# Patient Record
Sex: Male | Born: 1945
Health system: Southern US, Community
[De-identification: ages and names within clinical notes are randomized; demographics above are authoritative.]

## PROBLEM LIST (undated history)

## (undated) DIAGNOSIS — Z87442 Personal history of urinary calculi: Secondary | ICD-10-CM

## (undated) DIAGNOSIS — J45909 Unspecified asthma, uncomplicated: Secondary | ICD-10-CM

## (undated) DIAGNOSIS — K219 Gastro-esophageal reflux disease without esophagitis: Secondary | ICD-10-CM

## (undated) DIAGNOSIS — H269 Unspecified cataract: Secondary | ICD-10-CM

## (undated) DIAGNOSIS — M1990 Unspecified osteoarthritis, unspecified site: Secondary | ICD-10-CM

## (undated) DIAGNOSIS — I10 Essential (primary) hypertension: Secondary | ICD-10-CM

## (undated) HISTORY — DX: Gastro-esophageal reflux disease without esophagitis: K21.9

## (undated) HISTORY — PX: POLYPECTOMY: SHX149

## (undated) HISTORY — DX: Essential (primary) hypertension: I10

## (undated) HISTORY — PX: COLONOSCOPY: SHX174

## (undated) HISTORY — PX: CARPAL TUNNEL RELEASE: SHX101

## (undated) HISTORY — DX: Unspecified cataract: H26.9

## (undated) HISTORY — DX: Unspecified asthma, uncomplicated: J45.909

---

## 2004-06-12 HISTORY — PX: CARPAL TUNNEL RELEASE: SHX101

## 2006-01-11 ENCOUNTER — Encounter (INDEPENDENT_AMBULATORY_CARE_PROVIDER_SITE_OTHER): Payer: Self-pay | Admitting: *Deleted

## 2006-01-11 ENCOUNTER — Ambulatory Visit (HOSPITAL_BASED_OUTPATIENT_CLINIC_OR_DEPARTMENT_OTHER): Admission: RE | Admit: 2006-01-11 | Discharge: 2006-01-11 | Payer: Self-pay | Admitting: Orthopedic Surgery

## 2009-09-15 ENCOUNTER — Encounter (INDEPENDENT_AMBULATORY_CARE_PROVIDER_SITE_OTHER): Payer: Self-pay | Admitting: *Deleted

## 2009-09-16 ENCOUNTER — Encounter (INDEPENDENT_AMBULATORY_CARE_PROVIDER_SITE_OTHER): Payer: Self-pay | Admitting: *Deleted

## 2009-09-20 ENCOUNTER — Ambulatory Visit: Payer: Self-pay | Admitting: Gastroenterology

## 2009-10-11 ENCOUNTER — Ambulatory Visit: Payer: Self-pay | Admitting: Gastroenterology

## 2009-10-19 ENCOUNTER — Encounter: Payer: Self-pay | Admitting: Gastroenterology

## 2010-02-22 ENCOUNTER — Encounter: Payer: Self-pay | Admitting: Gastroenterology

## 2010-03-08 ENCOUNTER — Encounter: Payer: Self-pay | Admitting: Gastroenterology

## 2010-04-28 ENCOUNTER — Encounter (INDEPENDENT_AMBULATORY_CARE_PROVIDER_SITE_OTHER): Payer: Self-pay | Admitting: *Deleted

## 2010-06-03 ENCOUNTER — Encounter (INDEPENDENT_AMBULATORY_CARE_PROVIDER_SITE_OTHER): Payer: Self-pay | Admitting: *Deleted

## 2010-06-08 ENCOUNTER — Ambulatory Visit: Payer: Self-pay | Admitting: Gastroenterology

## 2010-06-12 HISTORY — PX: BACK SURGERY: SHX140

## 2010-06-22 ENCOUNTER — Encounter: Payer: Self-pay | Admitting: Gastroenterology

## 2010-06-22 ENCOUNTER — Ambulatory Visit
Admission: RE | Admit: 2010-06-22 | Discharge: 2010-06-22 | Payer: Self-pay | Source: Home / Self Care | Attending: Gastroenterology | Admitting: Gastroenterology

## 2010-07-12 NOTE — Letter (Signed)
Summary: Previsit letter  [facility]  [address] [place], [place] [address]   Phone: [phone]  Fax: [phone]       09/15/2009 MRN: [id number]  Adam Sherman [address] [place], [place]  [address]  Dear Mr. Norment,  Welcome to the Gastroenterology Division at [facility].    You are scheduled to see a nurse for your pre-procedure visit on 09-20-09 at 11:00a.m. on the 3rd floor at [facility], [address]  We ask that you try to arrive at our office 15 minutes prior to your appointment time to allow for check-in.  Your nurse visit will consist of discussing your medical and surgical history, your immediate family medical history, and your medications.    Please bring a complete list of all your medications or, if you prefer, bring the medication bottles and we will list them.  We will need to be aware of both prescribed and over the counter drugs.  We will need to know exact dosage information as well.  If you are on blood thinners (Coumadin, Plavix, Aggrenox, Ticlid, etc.) please call our office today/prior to your appointment, as we need to consult with your physician about holding your medication.   Please be prepared to read and sign documents such as consent forms, a financial agreement, and acknowledgement forms.  If necessary, and with your consent, a friend or relative is welcome to sit-in on the nurse visit with you.  Please bring your insurance card so that we may make a copy of it.  If your insurance requires a referral to see a specialist, please bring your referral form from your primary care physician.  No co-pay is required for this nurse visit.     If you cannot keep your appointment, please call [phone] to cancel or reschedule prior to your appointment date.  This allows [place] the opportunity to schedule an appointment for another patient in need of care.    Thank you for choosing [facility] for your medical  needs.  We appreciate the opportunity to care for you.  Please visit [place] at our website  to learn more about our practice.                     Sincerely.                                                                                                                   The Gastroenterology Division

## 2010-07-12 NOTE — Procedures (Signed)
Summary: Colonoscopy  Patient: Adam Sherman Note: All result statuses are Final unless otherwise noted.  Tests: (1) Colonoscopy (COL)   COL Colonoscopy           DONE     [facility]     [address]     [place], [place]  [address]           COLONOSCOPY PROCEDURE REPORT           PATIENT:  Adam Sherman, Adam Sherman  MR#:  [id number]     BIRTHDATE:  [birth year], 63 yrs. old  GENDER:  male     ENDOSCOPIST:  Rachael Fee, MD     REF. BY:  Adela Lank, M.D.     PROCEDURE DATE:  10/11/2009     PROCEDURE:  Colonoscopy with snare polypectomy     ASA CLASS:  Class II     INDICATIONS:  Routine Risk Screening     MEDICATIONS:   Fentanyl 25 mcg IV, Versed 4 mg IV           DESCRIPTION OF PROCEDURE:   After the risks benefits and     alternatives of the procedure were thoroughly explained, informed     consent was obtained.  Digital rectal exam was performed and     revealed no rectal masses.   The LB PCF-H180AL [id number] endoscope     was introduced through the anus and advanced to the cecum, which     was identified by both the appendix and ileocecal valve, without     limitations.  The quality of the prep was good, using MoviPrep.     The instrument was then slowly withdrawn as the colon was fully     examined.     <<PROCEDUREIMAGES>>           FINDINGS:  There were two sessile polyps in rectum. One was 4mm     across, removed with cold snare, located in proximal rectum, sent     to pathology (jar 1). One was 12mm across, located in very distal     rectum, probably 1cm from anal verge, removed with snare cauter in     piecemeal fashion and sent to pathology (jar 2) (see image5,     image6, image7, and image8).  Mild diverticulosis was found in the     sigmoid to descending colon segments (see image3).  External     hemorrhoids were found. These were small, not thrombosed.  This     was otherwise a normal examination of the colon (see image9,     image1, and image2).    Retroflexed views in the rectum revealed no     abnormalities.    The scope was then withdrawn from the patient     and the procedure completed.           COMPLICATIONS:  None     ENDOSCOPIC IMPRESSION:     1) Two polyps in rectum, one was >1cm and resected in piecemeal     fashion.  Both removed and sent to pathology.     2) Mild diverticulosis in the sigmoid to descending colon     segments     3) External hemorrhoids     4) Otherwise normal examination           RECOMMENDATIONS:     1) If the polyp(s) removed today are proven to be adenomatous     (pre-cancerous) polyps, you will need a colonoscopy in  1-3.     2) You will receive a letter within 1-2 weeks with the results     of your biopsy as well as final recommendations. Please call my     office if you have not received a letter after 3 weeks.           ______________________________     Rachael Fee, MD           n.     eSIGNED:   Rachael Fee at 10/11/2009 02:45 PM           Georges Lynch, [id number]  Note: An exclamation mark (!) indicates a result that was not dispersed into the flowsheet. Document Creation Date: 10/11/2009 2:47 PM _______________________________________________________________________  (1) Order result status: Final Collection or observation date-time: 10/11/2009 14:38 Requested date-time:  Receipt date-time:  Reported date-time:  Referring Physician:   Ordering Physician: Rob Bunting [phone]) Specimen Source:  Source: Launa Grill Order Number: [id number] Lab site:   Appended Document: Colonoscopy     Procedures Next Due Date:    Flexible Sigmoidoscopy: 04/2010

## 2010-07-12 NOTE — Letter (Signed)
Summary: Pre Visit Letter Revised  [facility]  [address] [place], [place] [address]   Phone: [phone]  Fax: [phone]        04/28/2010 MRN: [id number]   Adam Sherman [address] [place], [place]  [address]  Procedure Date: June 22, 2010  Welcome to the Gastroenterology Division at [facility].    You are scheduled to see a nurse for your pre-procedure visit on 06/08/10 at 9:00 A.M. on the 3rd floor at [facility], [address].  We ask that you try to arrive at our office 15 minutes prior to your appointment time to allow for check-in.  Please take a minute to review the attached form.  If you answer "Yes" to one or more of the questions on the first page, we ask that you call the person listed at your earliest opportunity.  If you answer "No" to all of the questions, please complete the rest of the form and bring it to your appointment.    Your nurse visit will consist of discussing your medical and surgical history, your immediate family medical history, and your medications.   If you are unable to list all of your medications on the form, please bring the medication bottles to your appointment and we will list them.  We will need to be aware of both prescribed and over the counter drugs.  We will need to know exact dosage information as well.    Please be prepared to read and sign documents such as consent forms, a financial agreement, and acknowledgement forms.  If necessary, and with your consent, a friend or relative is welcome to sit-in on the nurse visit with you.  Please bring your insurance card so that we may make a copy of it.  If your insurance requires a referral to see a specialist, please bring your referral form from your primary care physician.  No co-pay is required for this nurse visit.     If you cannot keep your appointment, please call [phone] to cancel or reschedule prior to your appointment date.  This allows  [place] the opportunity to schedule an appointment for another patient in need of care.    Thank you for choosing [facility] for your medical needs.  We appreciate the opportunity to care for you.  Please visit [place] at our website  to learn more about our practice.  Sincerely, The Gastroenterology Division

## 2010-07-12 NOTE — Miscellaneous (Signed)
Summary: previsit/rm  Clinical Lists Changes  Medications: Added new medication of MOVIPREP 100 GM  SOLR (PEG-KCL-NACL-NASULF-NA ASC-C) As per prep instructions. - Signed Rx of MOVIPREP 100 GM  SOLR (PEG-KCL-NACL-NASULF-NA ASC-C) As per prep instructions.;  #1 x 0;  Signed;  Entered by: Sherren Kerns RN;  Authorized by: Rachael Fee MD;  Method used: Electronically to [facility] [address] [place], [place], [place]  [address], Ph: [phone], Fax: [phone] Observations: Added new observation of ALLERGY REV: Done (09/20/2009 10:47) Added new observation of NKA: T (09/20/2009 10:47)    Prescriptions: MOVIPREP 100 GM  SOLR (PEG-KCL-NACL-NASULF-NA ASC-C) As per prep instructions.  #1 x 0   Entered by:   Sherren Kerns RN   Authorized by:   Rachael Fee MD   Signed by:   Sherren Kerns RN on 09/20/2009   Method used:   Electronically to        [facility] (retail)       [address]       [place], [place]  [address]       Ph: [phone]       Fax: [phone]   RxID:   [id number]

## 2010-07-12 NOTE — Letter (Signed)
Summary: [facility] Instructions  [facility]  [address] [place], [place] [address]   Phone: [phone]  Fax: [phone]       Adam Sherman    06-01-1946    MRN: [id number]        Procedure Day /Date: Monday  10/11/09     Arrival Time:  10:30am     Procedure Time:  11:30am     Location of Procedure:                    _ X_  [facility] (4th Floor)                        PREPARATION FOR COLONOSCOPY WITH MOVIPREP   Starting 5 days prior to your procedure Wednesday 04/27  do not eat nuts, seeds, popcorn, corn, beans, peas,  salads, or any raw vegetables.  Do not take any fiber supplements (e.g. Metamucil, Citrucel, and Benefiber).  THE DAY BEFORE YOUR PROCEDURE         DATE: 05/01  DAY: Sunday  1.  Drink clear liquids the entire day-NO SOLID FOOD  2.  Do not drink anything colored red or purple.  Avoid juices with pulp.  No orange juice.  3.  Drink at least 64 oz. (8 glasses) of fluid/clear liquids during the day to prevent dehydration and help the prep work efficiently.  CLEAR LIQUIDS INCLUDE: Water Jello Ice Popsicles Tea (sugar ok, no milk/cream) Powdered fruit flavored drinks Coffee (sugar ok, no milk/cream) Gatorade Juice: apple, white grape, white cranberry  Lemonade Clear bullion, consomm, broth Carbonated beverages (any kind) Strained chicken noodle soup Hard Candy                             4.  In the morning, mix first dose of MoviPrep solution:    Empty 1 Pouch A and 1 Pouch B into the disposable container    Add lukewarm drinking water to the top line of the container. Mix to dissolve    Refrigerate (mixed solution should be used within 24 hrs)  5.  Begin drinking the prep at 5:00 p.m. The MoviPrep container is divided by 4 marks.   Every 15 minutes drink the solution down to the next mark (approximately 8 oz) until the full liter is complete.   6.  Follow completed prep with 16 oz of clear liquid of your choice  (Nothing red or purple).  Continue to drink clear liquids until bedtime.  7.  Before going to bed, mix second dose of MoviPrep solution:    Empty 1 Pouch A and 1 Pouch B into the disposable container    Add lukewarm drinking water to the top line of the container. Mix to dissolve    Refrigerate  THE DAY OF YOUR PROCEDURE      DATE: 05/02  DAY: Monday  Beginning at  6:30 a.m. (5 hours before procedure):         1. Every 15 minutes, drink the solution down to the next mark (approx 8 oz) until the full liter is complete.  2. Follow completed prep with 16 oz. of clear liquid of your choice.    3. You may drink clear liquids until  9:30am (2 HOURS BEFORE PROCEDURE).   MEDICATION INSTRUCTIONS  Unless otherwise instructed, you should take regular prescription medications with a small sip of water   as early  as possible the morning of your procedure.   Additional medication instructions: n/a         OTHER INSTRUCTIONS  You will need a responsible adult at least 63 years of age to accompany you and drive you home.   This person must remain in the waiting room during your procedure.  Wear loose fitting clothing that is easily removed.  Leave jewelry and other valuables at home.  However, you may wish to bring a book to read or  an iPod/MP3 player to listen to music as you wait for your procedure to start.  Remove all body piercing jewelry and leave at home.  Total time from sign-in until discharge is approximately 2-3 hours.  You should go home directly after your procedure and rest.  You can resume normal activities the  day after your procedure.  The day of your procedure you should not:   Drive   Make legal decisions   Operate machinery   Drink alcohol   Return to work  You will receive specific instructions about eating, activities and medications before you leave.    The above instructions have been reviewed and explained to me by   Sherren Kerns RN  September 20, 2009 11:03 AM     I fully understand and can verbalize these instructions _____________________________ Date _________

## 2010-07-12 NOTE — Letter (Signed)
Summary: Flex Sigmoidoscopy Letter  [facility]  [address] [place], [place] [address]   Phone: [phone]  Fax: [phone]      March 08, 2010 MRN: [id number]   Adam Sherman [address] [place], [place]  [address]   Dear [name],   According to your medical record, it is time for you to schedule a Flexible Sigmoidoscopy. The American Cancer Society recommends this procedure as a method to detect early colon cancer. Patients with a family history of colon cancer, or a personal history of colon polyps or imflammatory bowel disease are at increased risk.  This letter has been generated based on the recommendations made at the time of your prior procedure. If you feel that in your particular situation this may no longer apply, please contact our office.  Please call our office at [phone]) to schedule this appointment or to update your records at your earliest convenience.  Thank you for cooperating with [place] to provide you with the very best care possible.   Sincerely,  Rachael Fee, M.D.   [facility] [phone]

## 2010-07-12 NOTE — Procedures (Signed)
Summary: Recall Assessment/[facility]  Recall Assessment/ GI   Imported By: Sherian Rein 03/11/2010 12:05:36  _____________________________________________________________________  External Attachment:    Type:   Image     Comment:   External Document

## 2010-07-12 NOTE — Letter (Signed)
Summary: Results Letter  [facility]  [address] [place], [place] [address]   Phone: [phone]  Fax: [phone]        Oct 19, 2009 MRN: [id number]    Adam Sherman [address] [place], [place]  [address]    Dear [name],   The polyp(s) removed during your recent procedure were proven to be adenomatous.  These are pre-cancerous polyps that may have grown into cancers if they had not been removed.  Based on current nationally recognized surveillance guidelines, I recommend that you have a flexible sigmoidoscopy in 6 months.   We will therefore put your information in our reminder system and will contact you in 6 months to schedule a repeat procedure.  Please call if you have any questions or concerns.       Sincerely,  Rachael Fee MD  This letter has been electronically signed by your physician.  Appended Document: Results Letter letter mailed 5.10.11

## 2010-07-14 NOTE — Procedures (Signed)
Summary: Flexible Sigmoidoscopy  Patient: [name] Note: All result statuses are Final unless otherwise noted.  Tests: (1) Flexible Sigmoidoscopy (FLX)  FLX Flexible Sigmoidoscopy                             DONE     [facility]     [address]     [place], [place]  [address]           FLEXIBLE SIGMOIDOSCOPY PROCEDURE REPORT           PATIENT:  Adam Sherman, Adam Sherman  MR#:  [id number]     BIRTHDATE:  1945-09-09, 64 yrs. old  GENDER:  male     ENDOSCOPIST:  Rachael Fee, MD     PROCEDURE DATE:  06/22/2010     PROCEDURE:  Flexible Sigmoidoscopy, diagnostic     ASA CLASS:  Class II     INDICATIONS:  adenomatous polyp removed from rectum 7 months ago     in piecemeal fashion     MEDICATIONS:  none           DESCRIPTION OF PROCEDURE:   After the risks benefits and     alternatives of the procedure were thoroughly explained, informed     consent was obtained.  Digital rectal exam was performed and     revealed no rectal masses.   The LB-PCF-H180AL [id number] endoscope     was introduced through the anus and advanced to the sigmoid colon,     without limitations.  The quality of the prep was good.  The     instrument was then slowly withdrawn as the mucosa was fully     examined.     <<PROCEDUREIMAGES>>     The site of adenomatous polyp removed was located very close to     anus. There was no clear, remaining adenomatous mucosa. The site     was treated with cautery from tip of cautery (see image1, image2,     image3, and image4).   Retroflexed views in the rectum revealed no     abnormalities.    The scope was then withdrawn from the patient     and the procedure terminated.     COMPLICATIONS:  None           ENDOSCOPIC IMPRESSION:     1) Site of previous piecemeal removal of 12mm polyp was clear of     obvious adenomatous mucosa, treated with cautery.           RECOMMENDATIONS:     Repeat colonoscopy in 3 years (adenoma removed last year was     12mm).        REPEAT EXAM:  3 years           ______________________________     Rachael Fee, MD           cc: Adela Lank, MD           n.     eSIGNED:   Rachael Fee at 06/22/2010 10:14 AM           Georges Lynch, [id number]  Note: An exclamation mark (!) indicates a result that was not dispersed into the flowsheet. Document Creation Date: 06/22/2010 10:14 AM _______________________________________________________________________  (1) Order result status: Final Collection or observation date-time: 06/22/2010 10:07 Requested date-time:  Receipt date-time:  Reported date-time:  Referring Physician:   Ordering Physician: Rob Bunting [phone]) Specimen Source:  Source: Launa Grill Order Number: [id number] Lab site:   Appended Document: Flexible Sigmoidoscopy    Clinical Lists Changes  Observations: Added new observation of COLONNXTDUE: 06/2013 (06/22/2010 13:48)

## 2010-07-14 NOTE — Miscellaneous (Signed)
Summary: [facility] Previsit/prep  Clinical Lists Changes  Observations: Added new observation of NKA: T (06/08/2010 8:33)

## 2010-07-14 NOTE — Letter (Signed)
Summary: [facility]  [address] [place], [place] [address]   Phone: [phone]  Fax: [phone]       Adam Sherman    07-07-1945    MRN: [id number]        Procedure Day Dorna Bloom: Wednesday 06-22-10      Arrival Time: 9:00 am     Procedure Time: 10:00 am     Location of Procedure:                    _x _  [facility] (4th Floor)  PREPARATION FOR FLEXIBLE SIGMOIDOSCOPY WITH MAGNESIUM CITRATE  Prior to the day before your procedure, purchase one 8 oz. bottle of Magnesium Citrate and one Fleet Enema from the laxative section of your drugstore.  _________________________________________________________________________________________________  THE DAY BEFORE YOUR PROCEDURE             DATE:  06-21-10   DAY:  Tuesday  1.   Have a clear liquid dinner the night before your procedure.  2.   Do not drink anything colored red or purple.  Avoid juices with pulp.  No orange juice.              CLEAR LIQUIDS INCLUDE: Water Jello Ice Popsicles Tea (sugar ok, no milk/cream) Powdered fruit flavored drinks Coffee (sugar ok, no milk/cream) Gatorade Juice: apple, white grape, white cranberry  Lemonade Clear bullion, consomm, broth Carbonated beverages (any kind) Strained chicken noodle soup Hard Candy   3.   At 7:00 pm the night before your procedure, drink one bottle of Magnesium Citrate over ice.  4.   Drink at least 3 more glasses of clear liquids before bedtime (preferably juices).  5.   Results are expected usually within 1 to 6 hours after taking the Magnesium Citrate.  ___________________________________________________________________________________________________  THE DAY OF YOUR PROCEDURE            DATE:  06-22-10 DAY:  Wednesday  1.   Use Fleet Enema one hour prior to coming for procedure .  2.   You may drink clear liquids until  8:00 a.m. (2 hours before exam)       MEDICATION INSTRUCTIONS  Unless  otherwise instructed, you should take regular prescription medications with a small sip of water as early as possible the morning of your procedure.  dditional medication instructions: Hold Losartan/HCTZ the morning of procedure.         OTHER INSTRUCTIONS  You will need a responsible adult at least 63 years of age to accompany you and drive you home.   This person must remain in the waiting room during your procedure.  Wear loose fitting clothing that is easily removed.  Leave jewelry and other valuables at home.  However, you may wish to bring a book to read or an iPod/MP3 player to listen to music as you wait for your procedure to start.  Remove all body piercing jewelry and leave at home.  Total time from sign-in until discharge is approximately 2-3 hours.  You should go home directly after your procedure and rest.  You can resume normal activities the day after your procedure.  The day of your procedure you should not:   Drive   Make legal decisions   Operate machinery   Drink alcohol   Return to work  You will receive specific instructions about eating, activities and medications before you leave.   The above instructions have been reviewed and explained to  me by   Wyona Almas RN  June 08, 2010 9:16 AM     I fully understand and can verbalize these instructions _____________________________ Date _________

## 2010-09-07 ENCOUNTER — Encounter (HOSPITAL_COMMUNITY)
Admission: RE | Admit: 2010-09-07 | Discharge: 2010-09-07 | Disposition: A | Discharge status: Home / Self Care | Payer: BC Managed Care – PPO | Source: Ambulatory Visit | Attending: Neurosurgery | Admitting: Neurosurgery

## 2010-09-07 ENCOUNTER — Other Ambulatory Visit (HOSPITAL_COMMUNITY): Payer: Self-pay | Admitting: Neurosurgery

## 2010-09-07 ENCOUNTER — Ambulatory Visit (HOSPITAL_COMMUNITY)
Admission: RE | Admit: 2010-09-07 | Discharge: 2010-09-07 | Disposition: A | Discharge status: Home / Self Care | Payer: BC Managed Care – PPO | Source: Ambulatory Visit | Attending: Neurosurgery | Admitting: Neurosurgery

## 2010-09-07 DIAGNOSIS — M5136 Other intervertebral disc degeneration, lumbar region: Secondary | ICD-10-CM

## 2010-09-07 DIAGNOSIS — Z01810 Encounter for preprocedural cardiovascular examination: Secondary | ICD-10-CM | POA: Insufficient documentation

## 2010-09-07 DIAGNOSIS — Z01812 Encounter for preprocedural laboratory examination: Secondary | ICD-10-CM | POA: Insufficient documentation

## 2010-09-07 DIAGNOSIS — M5137 Other intervertebral disc degeneration, lumbosacral region: Secondary | ICD-10-CM | POA: Insufficient documentation

## 2010-09-07 DIAGNOSIS — Z01818 Encounter for other preprocedural examination: Secondary | ICD-10-CM | POA: Insufficient documentation

## 2010-09-07 DIAGNOSIS — M51379 Other intervertebral disc degeneration, lumbosacral region without mention of lumbar back pain or lower extremity pain: Secondary | ICD-10-CM | POA: Insufficient documentation

## 2010-09-07 LAB — CBC
HCT: 44.5 % (ref 39.0–52.0)
Hemoglobin: 15.4 g/dL (ref 13.0–17.0)
RBC: 5.05 MIL/uL (ref 4.22–5.81)
WBC: 7.2 10*3/uL (ref 4.0–10.5)

## 2010-09-07 LAB — SURGICAL PCR SCREEN
MRSA, PCR: NEGATIVE
Staphylococcus aureus: POSITIVE — AB

## 2010-09-07 LAB — BASIC METABOLIC PANEL
Chloride: 100 mEq/L (ref 96–112)
GFR calc Af Amer: >60 mL/min (ref >60)
Potassium: 4.5 mEq/L (ref 3.5–5.1)
Sodium: 141 mEq/L (ref 135–145)

## 2010-09-07 IMAGING — CR DG CHEST 2V
2 series · 2 of 2 positions shown · non-contrast
Comparison: None.

CLINICAL DATA: Preop chest x-ray

CHEST - 2 VIEW

[view not recorded (1 of 2)]
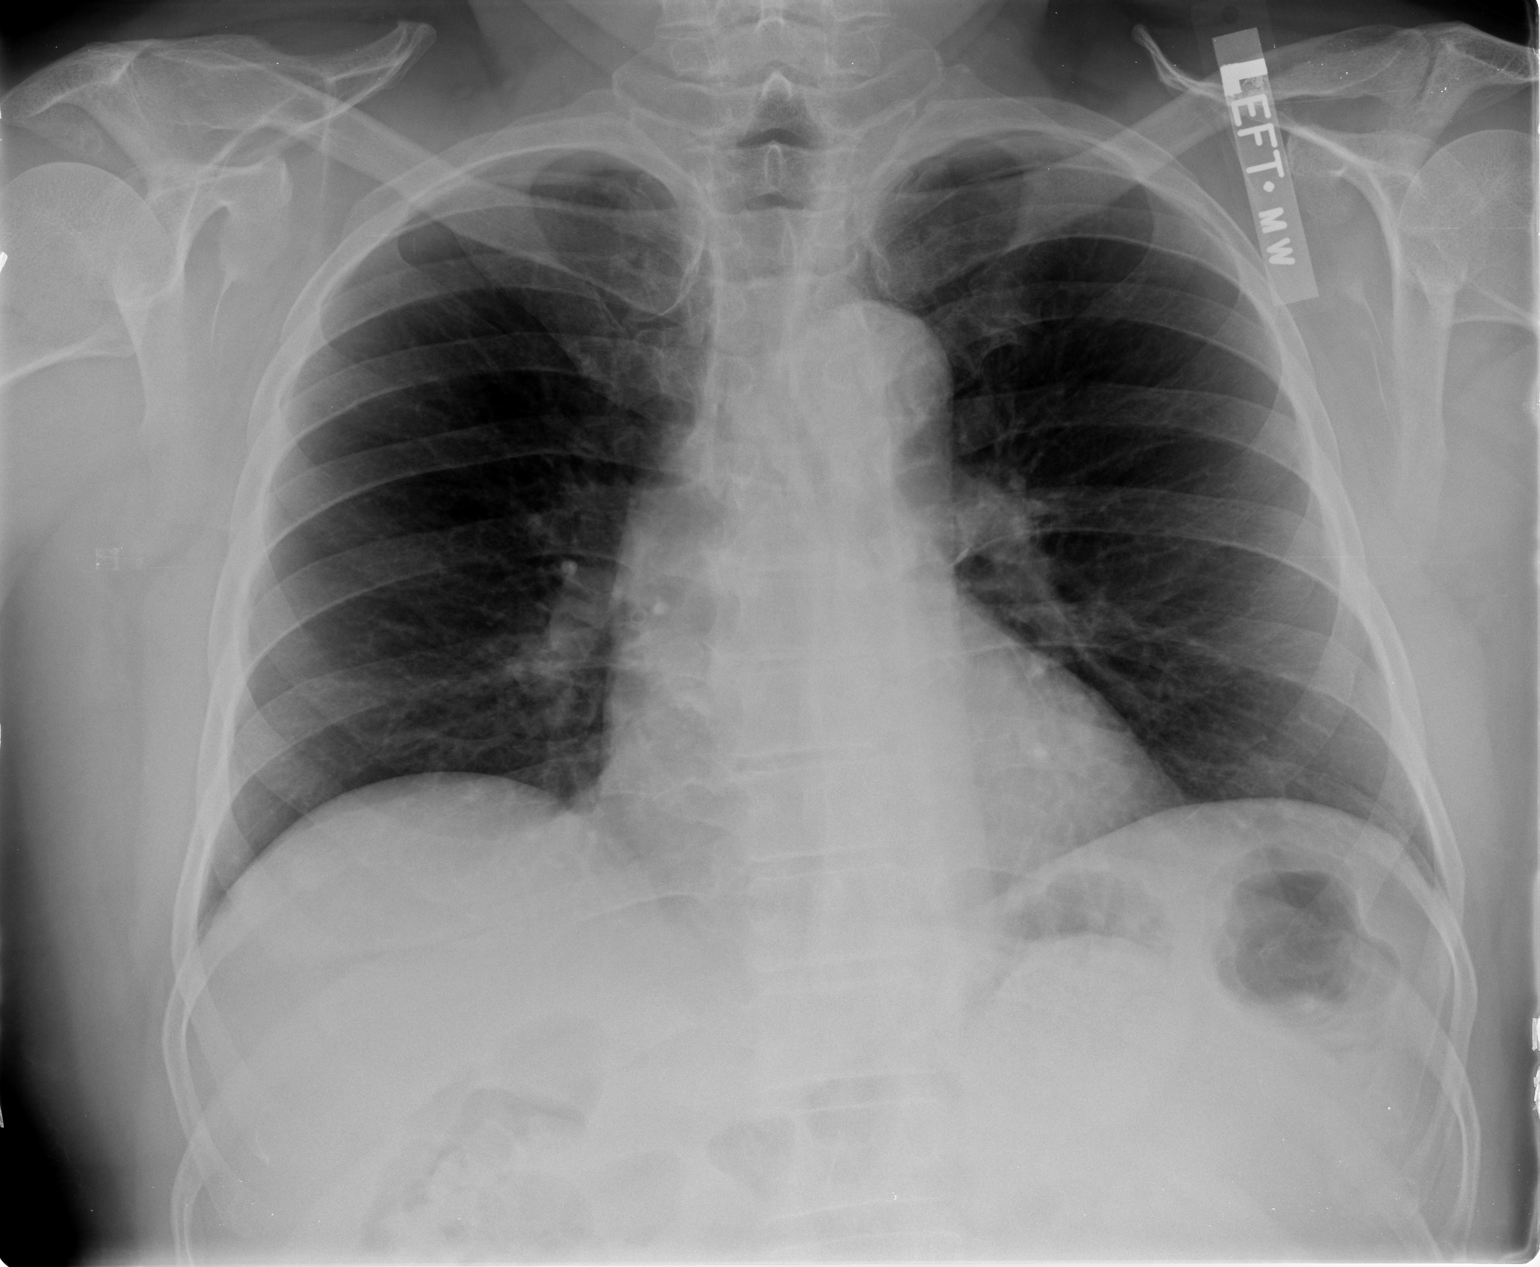

[view not recorded (2 of 2)]
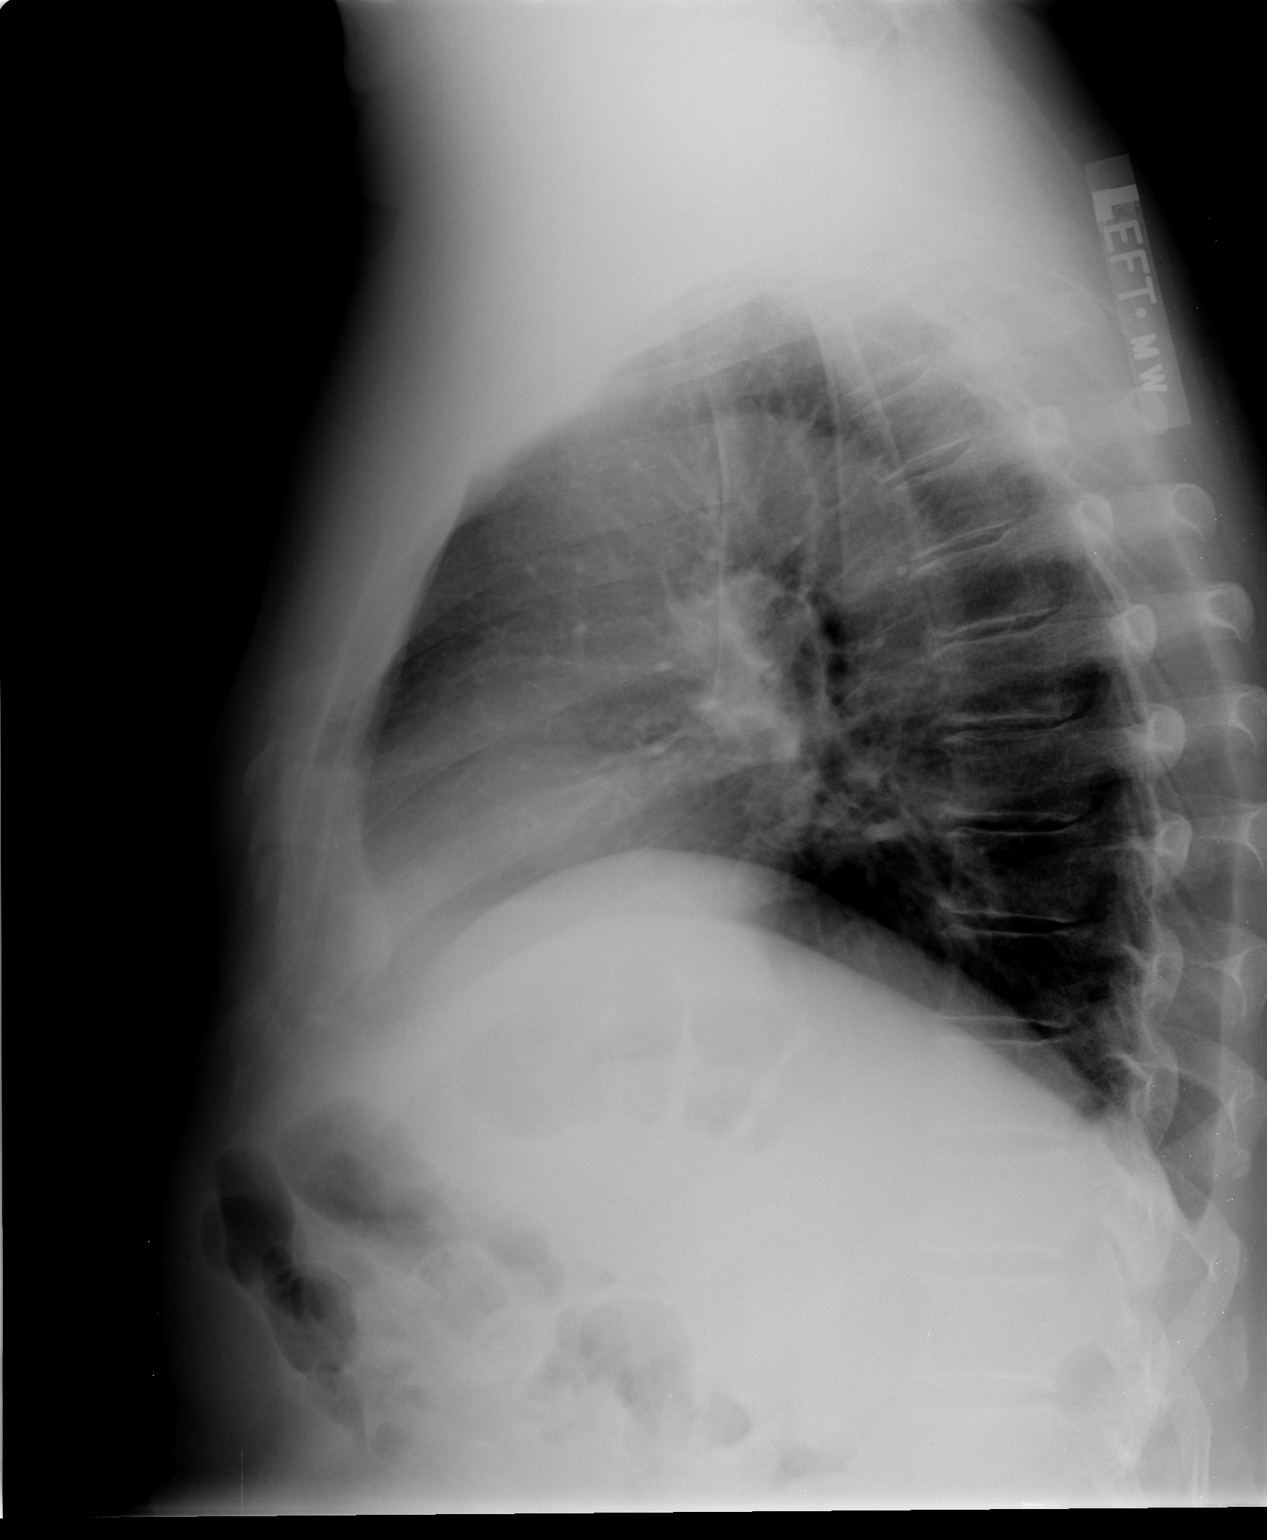

[2 of 2 positions shown; findings below may reference images not displayed]

FINDINGS: Relative low level of inspiration. Heart and mediastinal
contours normal.  Lungs clear.  No pleural fluid.  Osseous
structures and soft tissues unremarkable.
IMPRESSION: No acute or significant findings.

## 2010-09-09 ENCOUNTER — Observation Stay (HOSPITAL_COMMUNITY): Payer: BC Managed Care – PPO

## 2010-09-09 ENCOUNTER — Observation Stay (HOSPITAL_COMMUNITY)
Admission: RE | Admit: 2010-09-09 | Discharge: 2010-09-10 | Disposition: A | Discharge status: Home / Self Care | Payer: BC Managed Care – PPO | Source: Ambulatory Visit | Attending: Neurosurgery | Admitting: Neurosurgery

## 2010-09-09 DIAGNOSIS — I10 Essential (primary) hypertension: Secondary | ICD-10-CM | POA: Insufficient documentation

## 2010-09-09 DIAGNOSIS — K219 Gastro-esophageal reflux disease without esophagitis: Secondary | ICD-10-CM | POA: Insufficient documentation

## 2010-09-09 DIAGNOSIS — J45909 Unspecified asthma, uncomplicated: Secondary | ICD-10-CM | POA: Insufficient documentation

## 2010-09-09 DIAGNOSIS — M5126 Other intervertebral disc displacement, lumbar region: Principal | ICD-10-CM | POA: Insufficient documentation

## 2010-09-09 IMAGING — CR DG LUMBAR SPINE 1V
1 series · 1 of 1 positions shown · non-contrast
Comparison: None

CLINICAL DATA: Intraoperative localization for lumbar surgery.

LUMBAR SPINE - 1 VIEW

[view not recorded]
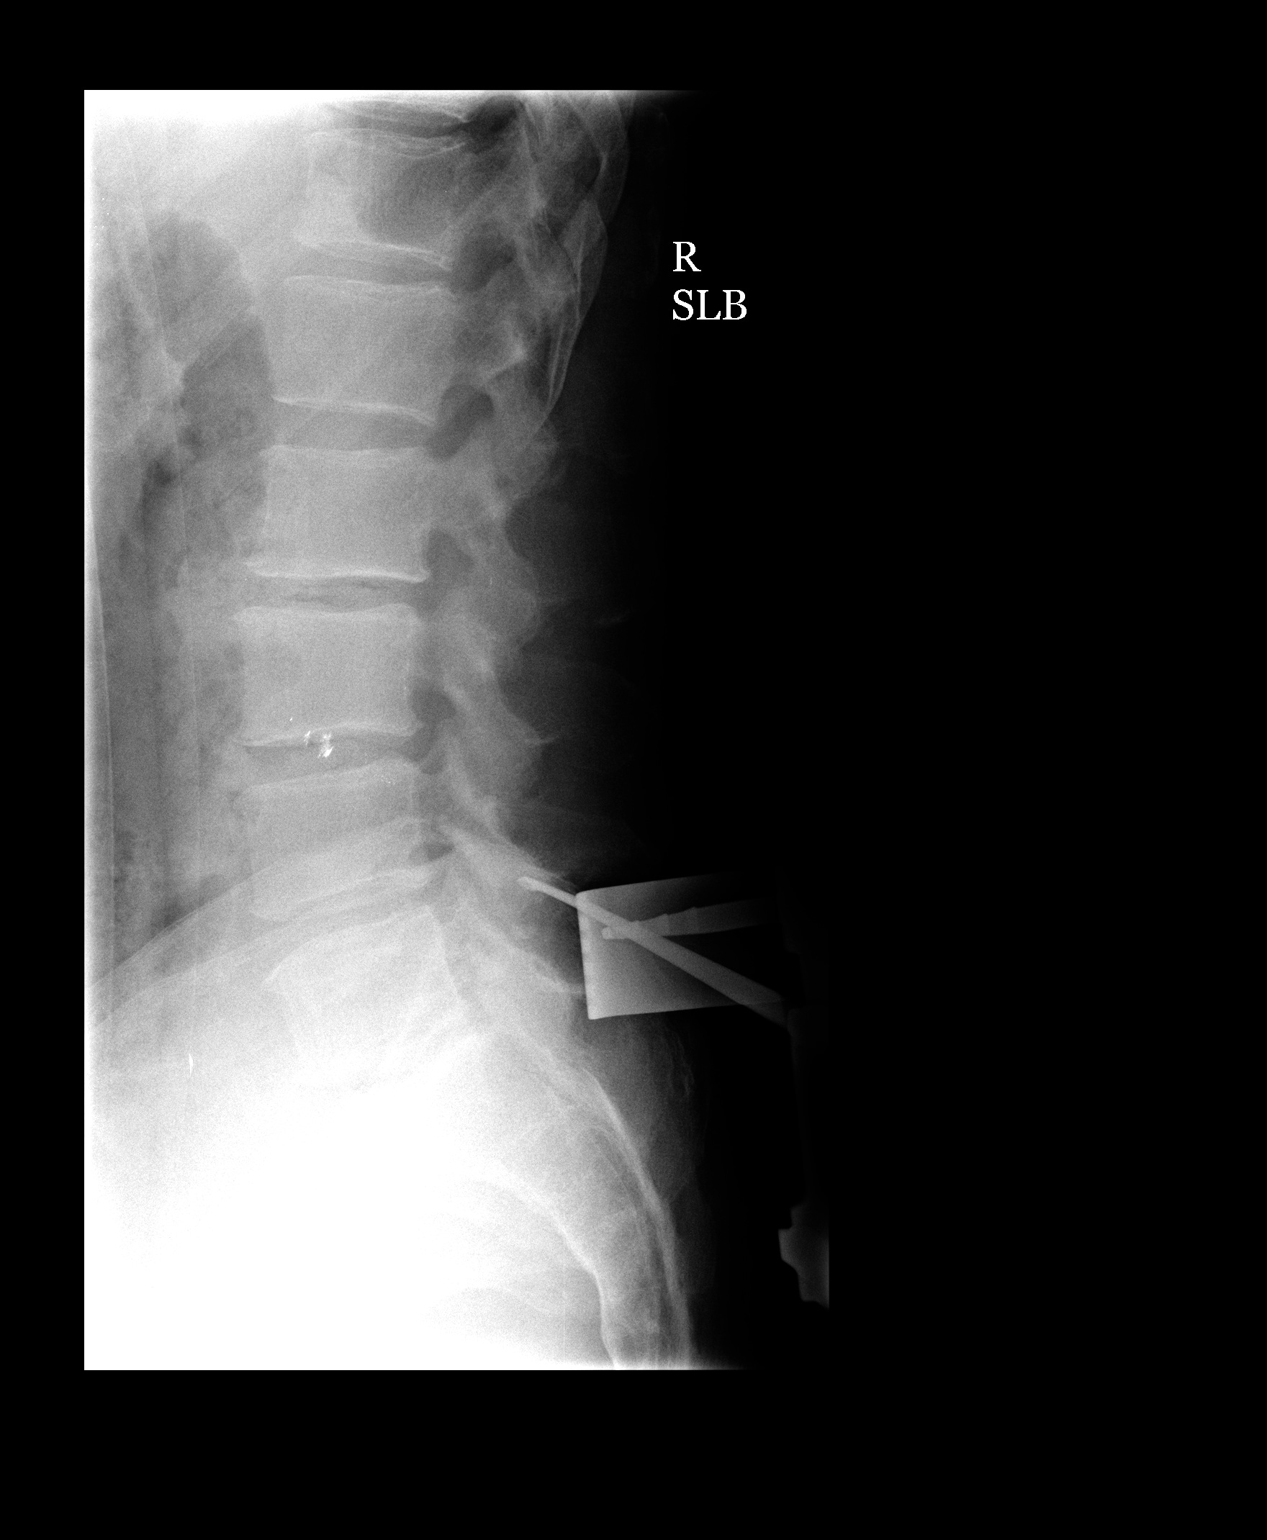

[1 of 1 positions shown; findings below may reference images not displayed]

FINDINGS: Single lateral lumbar spine film from the operating room
demonstrates a surgical instrument marking the L4-5 disc space
level.
IMPRESSION: L4-5 marked intraoperatively.

## 2010-09-13 NOTE — Op Note (Signed)
NAME:  Adam Sherman, DIPASQUALE NO.:  [id number]  MEDICAL RECORD NO.:  [id number]           PATIENT TYPE:  O  LOCATION:  3523                         FACILITY:  [facility]  PHYSICIAN:  Danae Orleans. Venetia Maxon, M.D.  DATE OF BIRTH:  May 03, 1946  DATE OF PROCEDURE:  09/09/2010 DATE OF DISCHARGE:  09/10/2010                              OPERATIVE REPORT   PREOPERATIVE DIAGNOSES:  Left L4-5 herniated lumbar disk with spondylosis degenerative disease and radiculopathy.  POSTOPERATIVE DIAGNOSES:  Left L4-5 herniated lumbar disk with spondylosis degenerative disease and radiculopathy.  PROCEDURE:  Left L4-5 microdiskectomy with microdissection.  SURGEON:  Danae Orleans. Venetia Maxon, MD  ASSISTANT:  Hewitt Shorts, MD  ANESTHESIA:  General endotracheal anesthesia.  ESTIMATED BLOOD LOSS:  Minimal.  COMPLICATIONS:  None.  DISPOSITION:  Recovery.  INDICATIONS:  [name] is a 65 year old male with severe left leg pain with a large disk herniation at L4-5 on the left causing severe left L5 radiculopathy, was elected to take him to surgery for microdiskectomy.  PROCEDURE:  Mr. Raju was brought to the operating room.  Following satisfactory and uncomplicated induction of general endotracheal anesthesia and placement of intravenous lines, the patient was placed in prone position on Wilson frame.  His low back was prepped and draped in usual sterile fashion.  The area of planned incision was infiltrated with local lidocaine.  An incision was made in the midline, carried to the lumbodorsal fascia, was incised sharply on the left side of midline. Subperiosteal dissection was performed exposing the L4-5 interspace and intraoperative x-ray confirmed correct orientation.  A hemi semi- laminectomy of L4 was performed with high-speed drill and completed with Kerrison rongeurs and the ligamentum flavum was detached and removed in piecemeal fashion.  Foraminotomy was performed overlying the L5  nerve roots and the thecal sac was mobilized medially.  There was a significant amount of herniated disk material which is causing nerve root and thecal sac compression.  The rest of the surgery was done under microscopic visualization.  Further mobilization of the neural elements allowed [place] to incise the annulus and multiple fragments of disk material were removed with significant decompression of thecal sac and the L5 nerve root.  Multiple fragments were removed from medially.  Curettes were used to remove additional loose disk material both medially and laterally.  Interspace was then irrigated.  There was no evidence of any residual loose disk material.  The operative site was bathed in Depo- Medrol and fentanyl after hemostasis was assured.  The self-retaining tractor was removed.  The lumbodorsal fascia was closed with 0 Vicryl suture, subcutaneous tissues were approximated 2-0 Vicryl interrupted inverted sutures and skin edges were approximated with 3-0 Vicryl subcuticular stitch.  Wound was dressed with Dermabond.  The patient was extubated in the operating, taken to the recovery room in stable satisfactory condition having tolerated his operation well.  Counts were correct at the end of the case.     Danae Orleans. Venetia Maxon, M.D.     JDS/MEDQ  D:  09/09/2010  T:  09/10/2010  Job:  [id number]  Electronically Signed by Maeola Harman M.D. on 09/13/2010 07:34:47  AM

## 2010-09-13 NOTE — H&P (Signed)
NAME:  Adam Sherman NO.:  [id number]  MEDICAL RECORD NO.:  [id number]           PATIENT TYPE:  O  LOCATION:  3523                         FACILITY:  [facility]  PHYSICIAN:  Danae Orleans. Venetia Maxon, M.D.  DATE OF BIRTH:  July 03, 1945  DATE OF ADMISSION:  09/09/2010 DATE OF DISCHARGE:                             HISTORY & PHYSICAL   REASON FOR ADMISSION:  Left L4-5 herniated lumbar disk.  HISTORY OF PRESENT ILLNESS:  Adam Sherman is a 65 year old retired man whom I saw in neurosurgical consultation on September 05, 2010, for low back and left leg pain.  He describes left leg pain, which has been going on for about 8 weeks.  He developed it after moving a tree.  He has decreased pain when he leans forward.  He has been taking 10/325 Percocet at night, he was using Relafen and diclofenac, but it did not help him.  He has a history of hypertension and mild asthma.  He is 5 feet and 8 inches tall, 215 pounds.  Denies tobacco, alcohol, or drug use.  PAST SURGICAL HISTORY:  Significant for right carpal tunnel release 3 years ago.  He had an MRI of his lumbar spine, which demonstrates a large disk herniation at L4-5 on the left.  His initial blood pressure is 144/104, heart rate is 74 and regular, respiratory rate is 16.  PHYSICAL EXAMINATION:  GENERAL:  Adam Sherman is a pleasant, cooperative man in no acute distress. HEENT:  Unremarkable. NECK:  Unremarkable. CHEST:  Unremarkable. HEART:  Unremarkable. ABDOMEN:  Unremarkable. MUSCULOSKELETAL:  He walks an antalgic gait favoring his left lower extremity, he cannot stand up straight.  He cannot bear any weight on his left leg.  He is able to stand on his toes on the left, but not able to stand on his heel on the left.  He has a positive straight leg raise at 30 degrees on the left.  He also hobbles when he walks and complains of severe left buttock pain.  He has Dupuytren contractures in both hands. NEUROLOGIC:  He is awake,  alert, and fully oriented.  Pupils are equal, round, and reactive to light.  Extraocular movements were intact. Facial, motor, and sensation are intact and symmetric.  Hearing is intact to finger rub.  On motor examination, he has full strength in his upper and lower extremities bilaterally symmetric with the exception of left dorsiflexion of 4/5, left extensor hallucis longus at 4-/5, left hip abductor strength at 4-/5.  Deep tendon reflexes are 2 in the biceps, triceps, and brachioradialis, 2 at the knees, 2 at the ankles. Great toes are downgoing on plantar stimulation.  Sensory examination is intact.  Cerebellar examination is normal.  IMPRESSION AND RECOMMENDATIONS:  Adam Sherman is a 65 year old man with a significant disk herniation at L4-5 on the left with left L5 radiculopathy.  Recommended that he undergo left L4-5 microdiskectomy for disk herniation and significant left L5 radiculopathy.  He wishes to proceed with surgery.  Risks and benefits were discussed with the patient.     Danae Orleans. Venetia Maxon, M.D.  JDS/MEDQ  D:  09/09/2010  T:  09/10/2010  Job:  [id number]  Electronically Signed by Maeola Harman M.D. on 09/13/2010 07:34:43 AM

## 2010-10-28 NOTE — Op Note (Signed)
NAME:  Adam Sherman, Adam Sherman NO.:  [id number]   MEDICAL RECORD NO.:  [id number]          PATIENT TYPE:  AMB   LOCATION:  DSC                          FACILITY:  [facility]   PHYSICIAN:  Cindee Salt, M.D.       DATE OF BIRTH:  13-Mar-1946   DATE OF PROCEDURE:  01/11/2006  DATE OF DISCHARGE:                                 OPERATIVE REPORT   PREOPERATIVE DIAGNOSIS:  Carpal tunnel syndrome with ruptured profundus  tendon left little finger.   POSTOPERATIVE DIAGNOSIS:  Carpal tunnel syndrome with ruptured profundus  tendon left little finger, plus abrasions of the ring finger.   PROCEDURE:  Carpal tunnel release, debridement of the profundus, left ring  finger.   SURGEON:  Cindee Salt, M.D.   ASSISTANT:  Carolyne Fiscal R.N.   ANESTHESIA:  IV regional.   HISTORY:  The patient is a 65 year old male with a history of a rupture of  his left little finger profundus tendon, etiology unknown.  He has elected  to proceed with the carpal tunnel release in that he has had numbness and  tingling.  Carpal tunnel EMG nerve conductions reveal carpal tunnel which  has not responded to conservative treatment.  He has elected not to have  anything done to the tendons at this time other than exploration to be  certain that there is not a correctable problem in the carpal canal  resulting in the rupture.  He is aware of potential risks of infection,  incomplete relief of symptoms, dystrophy, rupture of further tendons.   The patient is seen in the preoperative area questions encouraged and  answered.  The extremity marked.   PROCEDURE:  The patient was brought to the operating room where a forearm  based IV regional anesthetic was carried out without difficulty.  He was  prepped using DuraPrep, supine position, right arm free.  The incision was  made longitudinally in the palm, carried down through subcutaneous tissue.  Bleeders were electrocauterized.  Palmar fascia was split, superficial  palmar  arch identified.  Flexor tendon of the ring and little finger  identified to the ulnar side of median nerve.  The carpal retinaculum was  incised with sharp dissection.  The canal was explored.  Tenosynovium tissue  was moderately thickened.  Visualization of the hamate hook was not easily  obtained.  The wound was then extended proximally and distally, carried down  to the ulnar side of the wrist and then onto the distal forearm, carried  down through subcutaneous tissue.  Bleeders again electrocauterized.  The  entire canal was explored.  No lesions were noted in the canal.  There was  abrasion of the profundus tendon to the ring finger and the exploration  occurred to the metacarpophalangeal joints proximally and no lesions were  found carpometacarpal joints, the volar wrist, the hamate hook or the  radiocarpal or intercarpal joints.  The area on the ring finger was  minimally debrided.  The synovial biopsy taken and sent to pathology.  No  further lesions were identified.  The wound was irrigated.  The subcutaneous  tissue closed  with interrupted 4-0 Vicryl and skin with interrupted 5-0  nylon sutures.  Sterile compressive dressing and splint was applied.  The  patient tolerated the procedure well was taken to the recovery room for  observation in satisfactory condition.  He is discharged home to return to  [facility] in 1 week on Vicodin.           ______________________________  Cindee Salt, M.D.     GK/MEDQ  D:  01/11/2006  T:  01/11/2006  Job:  [id number]

## 2010-12-09 ENCOUNTER — Institutional Professional Consult (permissible substitution): Payer: BC Managed Care – PPO | Admitting: Internal Medicine

## 2013-04-25 ENCOUNTER — Encounter: Payer: Self-pay | Admitting: Gastroenterology

## 2013-05-01 ENCOUNTER — Encounter: Payer: Self-pay | Admitting: Gastroenterology

## 2013-06-17 ENCOUNTER — Ambulatory Visit (AMBULATORY_SURGERY_CENTER): Payer: Medicare Other

## 2013-06-17 VITALS — Ht 69.0 in | Wt 203.8 lb

## 2013-06-17 DIAGNOSIS — Z86010 Personal history of colonic polyps: Secondary | ICD-10-CM

## 2013-06-17 MED ORDER — MOVIPREP 100 G PO SOLR
ORAL | Status: DC
Start: 2013-06-17 — End: 2013-07-01

## 2013-06-18 ENCOUNTER — Encounter: Payer: Self-pay | Admitting: Gastroenterology

## 2013-07-01 ENCOUNTER — Ambulatory Visit (AMBULATORY_SURGERY_CENTER): Payer: Medicare Other | Admitting: Gastroenterology

## 2013-07-01 ENCOUNTER — Encounter: Payer: Self-pay | Admitting: Gastroenterology

## 2013-07-01 VITALS — BP 116/86 | HR 55 | Temp 97.7°F | Resp 23 | Ht 69.0 in | Wt 203.0 lb

## 2013-07-01 DIAGNOSIS — D126 Benign neoplasm of colon, unspecified: Secondary | ICD-10-CM

## 2013-07-01 DIAGNOSIS — K5730 Diverticulosis of large intestine without perforation or abscess without bleeding: Secondary | ICD-10-CM

## 2013-07-01 DIAGNOSIS — Z86010 Personal history of colonic polyps: Secondary | ICD-10-CM

## 2013-07-01 MED ORDER — SODIUM CHLORIDE 0.9 % IV SOLN: 500.0000 mL | INTRAVENOUS | Status: DC

## 2013-07-01 NOTE — Op Note (Signed)
[place]  [address] [place], [id number]   COLONOSCOPY PROCEDURE REPORT  PATIENT: Adam Sherman, Adam Sherman  MR#: [id number] BIRTHDATE: [birth year] , 70  yrs. old GENDER: Male ENDOSCOPIST: Milus Banister, MD PROCEDURE DATE:  07/01/2013 PROCEDURE:   Colonoscopy with snare polypectomy First Screening Colonoscopy - Avg.  risk and is 50 yrs.  old or older - No.  Prior Negative Screening - Now for repeat screening. N/A  History of Adenoma - Now for follow-up colonoscopy & has been > or = to 3 yrs.  Yes hx of adenoma.  Has been 3 or more years since last colonoscopy.  Polyps Removed Today? Yes. ASA CLASS:   Class II INDICATIONS:two adenomatous polyps removed 2011, one was piecemeal resected (99mm) from rectum, follow up flex sig 6 months later showed normal no clear recurrent adenoma MEDICATIONS: Fentanyl 75 mcg IV, Versed 5 mg IV, and These medications were titrated to patient response per physician's verbal order  DESCRIPTION OF PROCEDURE:   After the risks benefits and alternatives of the procedure were thoroughly explained, informed consent was obtained.  A digital rectal exam revealed no abnormalities of the rectum.   The LB [id number]  endoscope was introduced through the anus and advanced to the cecum, which was identified by both the appendix and ileocecal valve. No adverse events experienced.   The quality of the prep was good.  The instrument was then slowly withdrawn as the colon was fully examined.  COLON FINDINGS: Eight polyps were found, removed and six of them were retrieved to be sent to pathology.  These were all sessile, ranged in size from 49mm to 25mm, located in ascending, transverse and sigmoid segments.  All were retrieved using cold snare.  There were a few small left sided diverticulum.  The rectum was normal. Retroflexed views revealed no abnormalities. The time to cecum=3 minutes 17 seconds.  Withdrawal time=13 minutes 45 seconds.   The scope was withdrawn and the procedure completed. COMPLICATIONS: There were no complications.  ENDOSCOPIC IMPRESSION: Eight polyps were found, removed and six of them were retrieved to be sent to pathology.There were a few small left sided diverticulum.  The rectum was normal.  RECOMMENDATIONS: If the polyp(s) removed today are proven to be adenomatous (pre-cancerous) polyps, you will need a colonoscopy in 3 years. Otherwise you should continue to follow colorectal cancer screening guidelines for "routine risk" patients with a colonoscopy in 10 years.  You will receive a letter within 1-2 weeks with the results of your biopsy as well as final recommendations.  Please call my office if you have not received a letter after 3 weeks.  eSigned:  Milus Banister, MD 07/01/2013 11:41 AM  cc: Anda Kraft, MD

## 2013-07-01 NOTE — Patient Instructions (Signed)
YOU HAD AN ENDOSCOPIC PROCEDURE TODAY AT THE [facility]: Refer to the procedure report that was given to you for any specific questions about what was found during the examination.  If the procedure report does not answer your questions, please call your gastroenterologist to clarify.  If you requested that your care partner not be given the details of your procedure findings, then the procedure report has been included in a sealed envelope for you to review at your convenience later.  YOU SHOULD EXPECT: Some feelings of bloating in the abdomen. Passage of more gas than usual.  Walking can help get rid of the air that was put into your GI tract during the procedure and reduce the bloating. If you had a lower endoscopy (such as a colonoscopy or flexible sigmoidoscopy) you may notice spotting of blood in your stool or on the toilet paper. If you underwent a bowel prep for your procedure, then you may not have a normal bowel movement for a few days.  DIET: Your first meal following the procedure should be a light meal and then it is ok to progress to your normal diet.  A half-sandwich or bowl of soup is an example of a good first meal.  Heavy or fried foods are harder to digest and may make you feel nauseous or bloated.  Likewise meals heavy in dairy and vegetables can cause extra gas to form and this can also increase the bloating.  Drink plenty of fluids but you should avoid alcoholic beverages for 24 hours.  ACTIVITY: Your care partner should take you home directly after the procedure.  You should plan to take it easy, moving slowly for the rest of the day.  You can resume normal activity the day after the procedure however you should NOT DRIVE or use heavy machinery for 24 hours (because of the sedation medicines used during the test).    SYMPTOMS TO REPORT IMMEDIATELY: A gastroenterologist can be reached at any hour.  During normal business hours, 8:30 AM to 5:00 PM Monday through Friday,  call [phone].  After hours and on weekends, please call the GI answering service at [phone] who will take a message and have the physician on call contact you.   Following lower endoscopy (colonoscopy or flexible sigmoidoscopy):  Excessive amounts of blood in the stool  Significant tenderness or worsening of abdominal pains  Swelling of the abdomen that is new, acute  Fever of 100F or higher  FOLLOW UP: If any biopsies were taken you will be contacted by phone or by letter within the next 1-3 weeks.  Call your gastroenterologist if you have not heard about the biopsies in 3 weeks.  Our staff will call the home number listed on your records the next business day following your procedure to check on you and address any questions or concerns that you may have at that time regarding the information given to you following your procedure. This is a courtesy call and so if there is no answer at the home number and we have not heard from you through the emergency physician on call, we will assume that you have returned to your regular daily activities without incident.  SIGNATURES/CONFIDENTIALITY: You and/or your care partner have signed paperwork which will be entered into your electronic medical record.  These signatures attest to the fact that that the information above on your After Visit Summary has been reviewed and is understood.  Full responsibility of the confidentiality of this   discharge information lies with you and/or your care-partner.  Polyps-handout given  Repeat colonoscopy will be determined by pathology, but probably in 3 years.

## 2013-07-02 ENCOUNTER — Telehealth: Payer: Self-pay | Admitting: *Deleted

## 2013-07-02 NOTE — Telephone Encounter (Signed)
  Follow up Call-  Call back number 07/01/2013  Post procedure Call Back phone  # [phone]  Permission to leave phone message Yes     Patient questions:  Do you have a fever, pain , or abdominal swelling? no Pain Score  0 *  Have you tolerated food without any problems? yes  Have you been able to return to your normal activities? yes  Do you have any questions about your discharge instructions: Diet   no Medications  no Follow up visit  no  Do you have questions or concerns about your Care? no  Actions: * If pain score is 4 or above: No action needed, pain <4.

## 2013-07-08 ENCOUNTER — Encounter: Payer: Self-pay | Admitting: Gastroenterology

## 2015-05-17 ENCOUNTER — Ambulatory Visit: Payer: Self-pay | Admitting: Gastroenterology

## 2015-06-02 ENCOUNTER — Encounter: Payer: Self-pay | Admitting: Gastroenterology

## 2015-06-02 ENCOUNTER — Ambulatory Visit (INDEPENDENT_AMBULATORY_CARE_PROVIDER_SITE_OTHER): Payer: Medicare Other | Admitting: Gastroenterology

## 2015-06-02 VITALS — BP 120/88 | HR 76 | Ht 67.25 in | Wt 207.0 lb

## 2015-06-02 DIAGNOSIS — K439 Ventral hernia without obstruction or gangrene: Secondary | ICD-10-CM | POA: Diagnosis not present

## 2015-06-02 NOTE — Progress Notes (Signed)
Review of pertinent gastrointestinal problems: 1. Adenomatous colon polyps ; two adenomatous polyps removed 2011, one was piecemealresected (98mm) from rectum, follow up flex sig 6 months latershowed normal no clear recurrent adenoma. Repeat colonoscopy 2015 Dr. Ardis Hughs found 8 small polyps, most of these were adenomatous on biopsy. He was recommended to have repeat colonoscopy at 3-year interval.  HPI: This is a   very pleasant 69 year old man    who was referred to me by Anda Kraft, MD  to evaluate  possible ventral hernia .    Chief complaint is possible ventral hernia  Recent cardiac testing.  Dr. Nadyne Coombes.  This was done just as a matter of course.  A baseline Dr. Nadyne Coombes said he felt he had a ventral hernia.    Overall stable weight.  No gerd problems.  No post prandial pains.  Takes prilosec once daily.  Review of systems: Pertinent positive and negative review of systems were noted in the above HPI section. Complete review of systems was performed and was otherwise normal.   Past Medical History  Diagnosis Date  . Asthma   . Hypertension   . GERD (gastroesophageal reflux disease)     Past Surgical History  Procedure Laterality Date  . Carpal tunnel release      right hand 2006  . Back surgery  2012    ruptured disc    Current Outpatient Prescriptions  Medication Sig Dispense Refill  . amLODipine (NORVASC) 5 MG tablet Take 1 tablet by mouth daily.    . Fluticasone-Salmeterol (ADVAIR DISKUS) 100-50 MCG/DOSE AEPB Inhale 1 puff into the lungs daily.    Marland Kitchen GLUCOSAMINE-MSM-HYALURONIC ACD PO Take 1 tablet by mouth daily.    Marland Kitchen losartan-hydrochlorothiazide (HYZAAR) 100-25 MG per tablet Take 1 tablet by mouth daily.    . Omega-3 Fatty Acids (FISH OIL PO) Take 1,500 mg by mouth daily.    Marland Kitchen omeprazole (PRILOSEC) 20 MG capsule Take 20 mg by mouth daily.    . vitamin C (ASCORBIC ACID) 500 MG tablet Take 500 mg by mouth daily.     No current facility-administered medications for  this visit.    Allergies as of 06/02/2015  . (No Known Allergies)    Family History  Problem Relation Age of Onset  . Colon cancer Neg Hx   . Stomach cancer Neg Hx     Social History   Social History  . Marital Status: Married    Spouse Name: N/A  . Number of Children: N/A  . Years of Education: N/A   Occupational History  . Not on file.   Social History Main Topics  . Smoking status: Never Smoker   . Smokeless tobacco: Never Used  . Alcohol Use: No  . Drug Use: No  . Sexual Activity: Not on file   Other Topics Concern  . Not on file   Social History Narrative     Physical Exam: BP 120/88 mmHg  Pulse 76  Ht 5' 7.25" (1.708 m)  Wt 207 lb (93.895 kg)  BMI 32.19 kg/m2 Constitutional: generally well-appearing Psychiatric: alert and oriented x3 Eyes: extraocular movements intact Mouth: oral pharynx moist, no lesions Neck: supple no lymphadenopathy Cardiovascular: heart regular rate and rhythm Lungs: clear to auscultation bilaterally Abdomen: soft, nontender, nondistended, no obvious ascites, no peritoneal signs, normal bowel sounds; small to medium sized ventral hernia-like old with laxity of the abdominal muscles underneath. This is not tender. There is also a lipoma nearby that he says has been there for years. Lipomas  about 2 cm  Extremities: no lower extremity edema bilaterally Skin: no lesions on visible extremities   Assessment and plan: 69 y.o. male with  asymptomatic ventral hernia  he has absolutely no symptoms from this ventral hernia. I recommended they make no changes in his lifestyle and if he starts to have any difficulties with a hernia pain he should call here I would likely refer her to a general surgeon.    Owens Loffler, MD [facility] 06/02/2015, 9:17 AM  Cc: Anda Kraft, MD

## 2015-06-02 NOTE — Patient Instructions (Signed)
If the ventral hernia starts to cause symptoms (pains) then please call.

## 2015-08-09 ENCOUNTER — Institutional Professional Consult (permissible substitution): Payer: Medicare Other | Admitting: Emergency Medicine

## 2015-09-27 ENCOUNTER — Ambulatory Visit (INDEPENDENT_AMBULATORY_CARE_PROVIDER_SITE_OTHER): Payer: Medicare Other | Admitting: Emergency Medicine

## 2015-09-27 ENCOUNTER — Encounter: Payer: Self-pay | Admitting: Emergency Medicine

## 2015-09-27 VITALS — BP 120/90 | HR 76 | Ht 67.0 in | Wt 207.0 lb

## 2015-09-27 DIAGNOSIS — R0683 Snoring: Secondary | ICD-10-CM | POA: Insufficient documentation

## 2015-09-27 DIAGNOSIS — J45909 Unspecified asthma, uncomplicated: Secondary | ICD-10-CM | POA: Diagnosis not present

## 2015-09-27 MED ORDER — ALBUTEROL SULFATE HFA 108 (90 BASE) MCG/ACT IN AERS: 2.0000 | INHALATION_SPRAY | Freq: Four times a day (QID) | RESPIRATORY_TRACT | Status: DC | PRN

## 2015-09-27 NOTE — Assessment & Plan Note (Signed)
Without any other associated evidence for obstructive sleep apnea. I've asked his wife to listen to him sleeping, to listen for evidence of apneas or waking himself up snoring.

## 2015-09-27 NOTE — Progress Notes (Signed)
Subjective:    Patient ID: Adam Sherman, male    DOB: 1945/09/28, 70 y.o.   MRN: [id number]  HPI 70 year old man with a former minimal tobacco exposure, history of hypertension and GERD. He carries a reported hx of asthma that was made about 12 years ago in [place]. He has been on Advair since then. Sx including nocturnal awakenings, trouble getting his breath, a lot of am cough. Better w the Advair. He had PFT at that time.   Recent sx include morning cough when he wakes up, clear mucous. No cough during the day. Sometimes feels that his singing is limited.  Has never had a flare of asthma.  He snores loudly, no clear apneas seen. No daytime sleepiness. Very active.  Denies any GERD sx on therapy.  He notes that he was less reliable with Advair for a few nights, his wife questioned whether he was more SOB.  He is able to exert, no breathing limitation. 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     Review of Systems  Constitutional: Negative for fever and unexpected weight change.  HENT: Negative for congestion, dental problem, ear pain, nosebleeds, postnasal drip, rhinorrhea, sinus pressure, sneezing, sore throat and trouble swallowing.   Eyes: Negative for redness and itching.  Respiratory: Positive for cough. Negative for chest tightness, shortness of breath and wheezing.   Cardiovascular: Negative for palpitations and leg swelling.  Gastrointestinal: Negative for nausea and vomiting.  Genitourinary: Negative for dysuria.  Musculoskeletal: Negative for joint swelling.  Skin: Negative for rash.  Neurological: Negative for headaches.  Hematological: Does not bruise/bleed easily.  Psychiatric/Behavioral: Negative for dysphoric mood. The patient is not nervous/anxious.     Past Medical History  Diagnosis Date  . Asthma   . Hypertension   . GERD (gastroesophageal reflux disease)      Family History  Problem Relation Age of Onset  .  Colon cancer Neg Hx   . Stomach cancer Neg Hx      Social History   Social History  . Marital Status: Married    Spouse Name: N/A  . Number of Children: N/A  . Years of Education: N/A   Occupational History  . Not on file.   Social History Main Topics  . Smoking status: Former Smoker -- 0.10 packs/day for 3 years    Types: Cigarettes  . Smokeless tobacco: Never Used  . Alcohol Use: No  . Drug Use: No  . Sexual Activity: Not on file   Other Topics Concern  . 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   No Known Allergies   Outpatient Prescriptions Prior to Visit  Medication Sig Dispense Refill  . amLODipine (NORVASC) 5 MG tablet Take 1 tablet by mouth daily.    . Fluticasone-Salmeterol (ADVAIR DISKUS) 100-50 MCG/DOSE AEPB Inhale 1 puff into the lungs 2 (two) times daily.     Marland Kitchen GLUCOSAMINE-MSM-HYALURONIC ACD PO Take 1 tablet by mouth daily.    Marland Kitchen losartan-hydrochlorothiazide (HYZAAR) 100-25 MG per tablet Take 1 tablet by mouth daily.    . Omega-3 Fatty Acids (FISH OIL PO) Take 1,500 mg by mouth daily.    Marland Kitchen omeprazole (PRILOSEC) 20 MG capsule Take 20 mg by mouth daily.    . vitamin C (ASCORBIC ACID) 500 MG tablet Take 500 mg by mouth daily.     No facility-administered medications prior  to visit.         Objective:   Physical Exam Filed Vitals:   09/27/15 1504 09/27/15 1506  BP:  120/90  Pulse:  76  Height: 5\' 7"  (1.702 m)   Weight: 207 lb (93.895 kg)   SpO2:  97%   Gen: Pleasant, well-nourished, in no distress,  normal affect  ENT: No lesions,  mouth clear,  oropharynx clear, no postnasal drip  Neck: No JVD, no TMG, no carotid bruits  Lungs: No use of accessory muscles, no dullness to percussion, clear without rales or rhonchi  Cardiovascular: RRR, heart sounds normal, no murmur or gallops, no peripheral edema  Musculoskeletal: No deformities, no cyanosis or  clubbing  Neuro: alert, non focal  Skin: Warm, no lesions or rashes      Assessment & Plan:  Snoring Without any other associated evidence for obstructive sleep apnea. I've asked his wife to listen to him sleeping, to listen for evidence of apneas or waking himself up snoring.  Asthma He has never had an exacerbation. He was started 12 years ago and has been on Advair ever since. Unclear to me the degree of his airflow limitation. He may be able to get by with just albuterol when necessary. I'd like to perform PFT to quantify his obstruction.

## 2015-09-27 NOTE — Assessment & Plan Note (Signed)
He has never had an exacerbation. He was started 12 years ago and has been on Advair ever since. Unclear to me the degree of his airflow limitation. He may be able to get by with just albuterol when necessary. I'd like to perform PFT to quantify his obstruction.

## 2015-09-27 NOTE — Patient Instructions (Signed)
We will perform full pulmonary function testing. Please stop her Advair 3 days before you're scheduled breathing testing. Otherwise continue your Advair twice a day                  We may consider sleep testing at some point in the future Keep albuterol available to use 2 puffs if needed for shortness of breath Follow with Dr. Lamonte Sakai next available with full PFT

## 2015-09-28 ENCOUNTER — Telehealth: Payer: Self-pay | Admitting: Emergency Medicine

## 2015-09-28 NOTE — Telephone Encounter (Signed)
Will route message to RB to make him aware. 

## 2015-09-28 NOTE — Telephone Encounter (Signed)
Patients wife calling to give information regarding patient's provider information that was requested at [facility]: Georgetta Haber, [facility]. To Ria Comment for [employer]

## 2015-09-28 NOTE — Telephone Encounter (Signed)
Thank you :)

## 2015-10-18 ENCOUNTER — Encounter: Payer: Self-pay | Admitting: Gastroenterology

## 2015-12-08 ENCOUNTER — Ambulatory Visit: Payer: Medicare Other | Admitting: Emergency Medicine

## 2016-03-08 ENCOUNTER — Ambulatory Visit (INDEPENDENT_AMBULATORY_CARE_PROVIDER_SITE_OTHER): Payer: Medicare Other | Admitting: Emergency Medicine

## 2016-03-08 ENCOUNTER — Encounter: Payer: Self-pay | Admitting: Emergency Medicine

## 2016-03-08 DIAGNOSIS — J45909 Unspecified asthma, uncomplicated: Secondary | ICD-10-CM

## 2016-03-08 DIAGNOSIS — R0683 Snoring: Secondary | ICD-10-CM

## 2016-03-08 LAB — PULMONARY FUNCTION TEST
DL/VA % PRED: 107 %
DL/VA: 4.82 ml/min/mmHg/L
DLCO COR % PRED: 95 %
DLCO UNC % PRED: 96 %
DLCO cor: 28.32 ml/min/mmHg
DLCO unc: 28.56 ml/min/mmHg
FEF 25-75 PRE: 4.06 L/s
FEF 25-75 Post: 4.05 L/sec
FEF2575-%CHANGE-POST: 0 %
FEF2575-%PRED-POST: 170 %
FEF2575-%Pred-Pre: 170 %
FEV1-%Change-Post: 0 %
FEV1-%Pred-Post: 100 %
FEV1-%Pred-Pre: 100 %
FEV1-PRE: 3.1 L
FEV1-Post: 3.1 L
FEV1FVC-%CHANGE-POST: 2 %
FEV1FVC-%Pred-Pre: 112 %
FEV6-%Change-Post: -2 %
FEV6-%Pred-Post: 92 %
FEV6-%Pred-Pre: 94 %
FEV6-Post: 3.63 L
FEV6-Pre: 3.72 L
FEV6FVC-%Change-Post: 0 %
FEV6FVC-%PRED-PRE: 106 %
FEV6FVC-%Pred-Post: 105 %
FVC-%Change-Post: -2 %
FVC-%Pred-Post: 87 %
FVC-%Pred-Pre: 89 %
FVC-PRE: 3.74 L
FVC-Post: 3.65 L
POST FEV1/FVC RATIO: 85 %
PRE FEV6/FVC RATIO: 100 %
Post FEV6/FVC ratio: 99 %
Pre FEV1/FVC ratio: 83 %
RV % PRED: 90 %
RV: 2.09 L
TLC % pred: 93 %
TLC: 6.19 L

## 2016-03-08 NOTE — Assessment & Plan Note (Signed)
No evidence of asthma based on his pulmonary function testing. Lung volumes and diffusion capacity were also normal. I asked him to stop his Advair, stop albuterol. If he misses the Advair he will call me and we will arrange follow-up

## 2016-03-08 NOTE — Patient Instructions (Signed)
Your breathing testing does not show any evidence for chronic asthma.  We will stop advair and albuterol Follow with Dr Lamonte Sakai if needed.

## 2016-03-08 NOTE — Progress Notes (Signed)
Subjective:    Patient ID: Adam Sherman, male    DOB: 10/24/1945, 70 y.o.   MRN: [id number]  Asthma  He complains of cough. There is no shortness of breath or wheezing. Pertinent negatives include no ear pain, fever, headaches, postnasal drip, rhinorrhea, sneezing, sore throat or trouble swallowing. His past medical history is significant for asthma.   70 year old man with a former minimal tobacco exposure, history of hypertension and GERD. He carries a reported hx of asthma that was made about 12 years ago in [place]. He has been on Advair since then. Sx including nocturnal awakenings, trouble getting his breath, a lot of am cough. Better w the Advair. He had PFT at that time.   Recent sx include morning cough when he wakes up, clear mucous. No cough during the day. Sometimes feels that his singing is limited.  Has never had a flare of asthma.  He snores loudly, no clear apneas seen. No daytime sleepiness. Very active.  Denies any GERD sx on therapy.  He notes that he was less reliable with Advair for a few nights, his wife questioned whether he was more SOB.  He is able to exert, no breathing limitation. 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   ROV 03/08/16 -- Is a follow-up visit for history of dyspnea and suspected asthma based on a clinical diagnosis made over a decade ago. Pulmonary function testing today which I personally reviewed. This showed normal airflows, normal lung volumes, normal diffusion capacity. We also speculated that he may need a polysomnogram based on history of snoring. He stopped advair 3 days ago, hasn't missed it.                                         Review of Systems  Constitutional: Negative for fever and unexpected weight change.  HENT: Negative for congestion, dental problem, ear pain, nosebleeds, postnasal drip, rhinorrhea, sinus pressure, sneezing, sore throat and trouble swallowing.   Eyes: Negative for  redness and itching.  Respiratory: Positive for cough. Negative for chest tightness, shortness of breath and wheezing.   Cardiovascular: Negative for palpitations and leg swelling.  Gastrointestinal: Negative for nausea and vomiting.  Genitourinary: Negative for dysuria.  Musculoskeletal: Negative for joint swelling.  Skin: Negative for rash.  Neurological: Negative for headaches.  Hematological: Does not bruise/bleed easily.  Psychiatric/Behavioral: Negative for dysphoric mood. The patient is not nervous/anxious.     Past Medical History:  Diagnosis Date  . Asthma   . GERD (gastroesophageal reflux disease)   . Hypertension      Family History  Problem Relation Age of Onset  . Colon cancer Neg Hx   . Stomach cancer Neg Hx      Social History   Social History  . Marital status: Married    Spouse name: N/A  . Number of children: N/A  . Years of education: N/A   Occupational History  . Not on file.   Social History Main Topics  . Smoking status: Former Smoker    Packs/day: 0.10    Years: 3.00    Types: Cigarettes  . Smokeless tobacco: Never Used  . Alcohol use No  . Drug use: No  . Sexual activity: Not on file   Other Topics Concern  . Not on file   Social History Narrative  . 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   No Known Allergies   Outpatient Medications Prior to Visit  Medication Sig Dispense Refill  . albuterol (PROVENTIL HFA;VENTOLIN HFA) 108 (90 Base) MCG/ACT inhaler Inhale 2 puffs into the lungs every 6 (six) hours as needed for wheezing or shortness of breath. 1 Inhaler 2  . amLODipine (NORVASC) 5 MG tablet Take 1 tablet by mouth daily.    . Fluticasone-Salmeterol (ADVAIR DISKUS) 100-50 MCG/DOSE AEPB Inhale 1 puff into the lungs 2 (two) times daily.     Marland Kitchen GLUCOSAMINE-MSM-HYALURONIC ACD PO Take 1 tablet by mouth daily.    Marland Kitchen losartan-hydrochlorothiazide (HYZAAR) 100-25 MG per tablet  Take 1 tablet by mouth daily.    . Omega-3 Fatty Acids (FISH OIL PO) Take 1,500 mg by mouth daily.    Marland Kitchen omeprazole (PRILOSEC) 20 MG capsule Take 20 mg by mouth daily.     No facility-administered medications prior to visit.          Objective:   Physical Exam Vitals:   03/08/16 1356 03/08/16 1400  BP:  122/84  Pulse:  62  SpO2:  94%  Weight: 208 lb (94.3 kg)   Height: 5\' 8"  (1.727 m)    Gen: Pleasant, well-nourished, in no distress,  normal affect  ENT: No lesions,  mouth clear,  oropharynx clear, no postnasal drip  Neck: No JVD, no TMG, no carotid bruits  Lungs: No use of accessory muscles, bibasilar insp crackles  Cardiovascular: RRR, heart sounds normal, no murmur or gallops, no peripheral edema  Musculoskeletal: No deformities, no cyanosis or clubbing  Neuro: alert, non focal  Skin: Warm, no lesions or rashes      Assessment & Plan:  Snoring His wife states that his snoring is much improved since they raised the head of his bed. He has not had any evidence of witnessed apneas  Asthma No evidence of asthma based on his pulmonary function testing. Lung volumes and diffusion capacity were also normal. I asked him to stop his Advair, stop albuterol. If he misses the Advair he will call me and we will arrange follow-up  Baltazar Apo, MD, PhD 03/08/2016, 2:41 PM [facility] [phone] or if no answer [phone]

## 2016-03-08 NOTE — Assessment & Plan Note (Signed)
His wife states that his snoring is much improved since they raised the head of his bed. He has not had any evidence of witnessed apneas

## 2016-05-03 ENCOUNTER — Encounter: Payer: Self-pay | Admitting: Gastroenterology

## 2016-05-08 ENCOUNTER — Encounter: Payer: Self-pay | Admitting: Gastroenterology

## 2016-06-12 HISTORY — PX: COLONOSCOPY: SHX174

## 2016-06-22 ENCOUNTER — Ambulatory Visit (AMBULATORY_SURGERY_CENTER): Payer: Self-pay | Admitting: *Deleted

## 2016-06-22 VITALS — Ht 67.0 in | Wt 218.0 lb

## 2016-06-22 DIAGNOSIS — Z86010 Personal history of colonic polyps: Secondary | ICD-10-CM

## 2016-06-22 MED ORDER — NA SULFATE-K SULFATE-MG SULF 17.5-3.13-1.6 GM/177ML PO SOLN: ORAL | 0 refills | Status: DC

## 2016-06-22 NOTE — Progress Notes (Signed)
Patient denies any allergies to eggs or soy. Patient denies any problems with anesthesia/sedation. Patient denies any oxygen use at home and does not take any diet/weight loss medications. EMMI education declined by patient.  

## 2016-06-28 ENCOUNTER — Encounter: Payer: Self-pay | Admitting: Gastroenterology

## 2016-07-03 ENCOUNTER — Telehealth: Payer: Self-pay | Admitting: Gastroenterology

## 2016-07-03 DIAGNOSIS — Z86010 Personal history of colonic polyps: Secondary | ICD-10-CM

## 2016-07-03 NOTE — Telephone Encounter (Signed)
prescription was sent at pre visit, I tried to call the pt and the phone was not taking calls

## 2016-07-04 ENCOUNTER — Telehealth: Payer: Self-pay | Admitting: Gastroenterology

## 2016-07-04 NOTE — Telephone Encounter (Signed)
Called in [place] to [employer].  They will inform patient that it is there.

## 2016-07-05 NOTE — Telephone Encounter (Signed)
Unable to reach pt, no voice mail.

## 2016-07-06 MED ORDER — NA SULFATE-K SULFATE-MG SULF 17.5-3.13-1.6 GM/177ML PO SOLN
ORAL | 0 refills | Status: DC
Start: 2016-07-06 — End: 2016-07-12

## 2016-07-06 NOTE — Telephone Encounter (Signed)
Left message on machine to call back at mobile number

## 2016-07-06 NOTE — Telephone Encounter (Signed)
Pt returned call and prep was resent to the pharmacy.  He will call back with any problems

## 2016-07-06 NOTE — Addendum Note (Signed)
Addended by: Barron Alvine on: 07/06/2016 09:43 AM   Modules accepted: Orders

## 2016-07-12 ENCOUNTER — Encounter: Payer: Self-pay | Admitting: Gastroenterology

## 2016-07-12 ENCOUNTER — Ambulatory Visit (AMBULATORY_SURGERY_CENTER): Payer: Medicare Other | Admitting: Gastroenterology

## 2016-07-12 VITALS — BP 107/81 | HR 71 | Temp 97.8°F | Resp 16 | Ht 67.0 in | Wt 218.0 lb

## 2016-07-12 DIAGNOSIS — Z86010 Personal history of colonic polyps: Secondary | ICD-10-CM | POA: Diagnosis not present

## 2016-07-12 DIAGNOSIS — D122 Benign neoplasm of ascending colon: Secondary | ICD-10-CM

## 2016-07-12 DIAGNOSIS — D123 Benign neoplasm of transverse colon: Secondary | ICD-10-CM

## 2016-07-12 DIAGNOSIS — D124 Benign neoplasm of descending colon: Secondary | ICD-10-CM

## 2016-07-12 DIAGNOSIS — K5730 Diverticulosis of large intestine without perforation or abscess without bleeding: Secondary | ICD-10-CM

## 2016-07-12 MED ORDER — SODIUM CHLORIDE 0.9 % IV SOLN: 500.0000 mL | INTRAVENOUS | Status: DC

## 2016-07-12 NOTE — Progress Notes (Signed)
Called to room to assist during endoscopic procedure.  Patient ID and intended procedure confirmed with present staff. Received instructions for my participation in the procedure from the performing physician.  

## 2016-07-12 NOTE — Progress Notes (Signed)
Pt's states no medical or surgical changes since previsit or office visit. 

## 2016-07-12 NOTE — Progress Notes (Signed)
To recovery, report to Jones, RN, VSS 

## 2016-07-12 NOTE — Patient Instructions (Signed)
YOU HAD AN ENDOSCOPIC PROCEDURE TODAY AT [facility]:   Refer to the procedure report that was given to you for any specific questions about what was found during the examination.  If the procedure report does not answer your questions, please call your gastroenterologist to clarify.  If you requested that your care partner not be given the details of your procedure findings, then the procedure report has been included in a sealed envelope for you to review at your convenience later.  YOU SHOULD EXPECT: Some feelings of bloating in the abdomen. Passage of more gas than usual.  Walking can help get rid of the air that was put into your GI tract during the procedure and reduce the bloating. If you had a lower endoscopy (such as a colonoscopy or flexible sigmoidoscopy) you may notice spotting of blood in your stool or on the toilet paper. If you underwent a bowel prep for your procedure, you may not have a normal bowel movement for a few days.  Please Note:  You might notice some irritation and congestion in your nose or some drainage.  This is from the oxygen used during your procedure.  There is no need for concern and it should clear up in a day or so.  SYMPTOMS TO REPORT IMMEDIATELY:   Following lower endoscopy (colonoscopy or flexible sigmoidoscopy):  Excessive amounts of blood in the stool  Significant tenderness or worsening of abdominal pains  Swelling of the abdomen that is new, acute  Fever of 100F or higher  For urgent or emergent issues, a gastroenterologist can be reached at any hour by calling [phone].   DIET:  We do recommend a small meal at first, but then you may proceed to your regular diet.  Drink plenty of fluids but you should avoid alcoholic beverages for 24 hours.  ACTIVITY:  You should plan to take it easy for the rest of today and you should NOT DRIVE or use heavy machinery until tomorrow (because of the sedation medicines used during the test).     FOLLOW UP: Our staff will call the number listed on your records the next business day following your procedure to check on you and address any questions or concerns that you may have regarding the information given to you following your procedure. If we do not reach you, we will leave a message.  However, if you are feeling well and you are not experiencing any problems, there is no need to return our call.  We will assume that you have returned to your regular daily activities without incident.  If any biopsies were taken you will be contacted by phone or by letter within the next 1-3 weeks.  Please call us at [phone] if you have not heard about the biopsies in 3 weeks.   Polyps (handout given) Diverticulosis (handout given) Await biopsy results to determined next repeat Colonoscopy   SIGNATURES/CONFIDENTIALITY: You and/or your care partner have signed paperwork which will be entered into your electronic medical record.  These signatures attest to the fact that that the information above on your After Visit Summary has been reviewed and is understood.  Full responsibility of the confidentiality of this discharge information lies with you and/or your care-partner.

## 2016-07-12 NOTE — Op Note (Signed)
[facility] Patient Name: Adam Sherman Procedure Date: 07/12/2016 9:44 AM MRN: [id number] Endoscopist: Milus Banister , MD Age: 71 Referring MD:  Date of Birth: 09-24-1945 Gender: Male Account #: [id number] Procedure:                Colonoscopy Indications:              High risk colon cancer surveillance: Personal                            history of colonic polyps; Adenomatous colon                            polyps; two adenomatous polyps removed 2011, one                            was piecemealresected (49mm) from rectum, follow up                            flex sig 6 months latershowed normal no clear                            recurrent adenoma. Repeat colonoscopy 2015 Dr.                            Ardis Sherman found 8 small polyps, most of these were                            adenomatous on biopsy. He was recommended to have                            repeat colonoscopy at 3-year interval. Medicines:                Monitored Anesthesia Care Procedure:                Pre-Anesthesia Assessment:                           - Prior to the procedure, a History and Physical                            was performed, and patient medications and                            allergies were reviewed. The patient's tolerance of                            previous anesthesia was also reviewed. The risks                            and benefits of the procedure and the sedation                            options and risks were discussed with the patient.  All questions were answered, and informed consent                            was obtained. Prior Anticoagulants: The patient has                            taken no previous anticoagulant or antiplatelet                            agents. ASA Grade Assessment: II - A patient with                            mild systemic disease. After reviewing the risks                            and benefits, the patient  was deemed in                            satisfactory condition to undergo the procedure.                           After obtaining informed consent, the colonoscope                            was passed under direct vision. Throughout the                            procedure, the patient's blood pressure, pulse, and                            oxygen saturations were monitored continuously. The                            Model PCF-[id number] scope was introduced                            through the anus and advanced to the the cecum,                            identified by appendiceal orifice and ileocecal                            valve. The colonoscopy was performed without                            difficulty. The patient tolerated the procedure                            well. The quality of the bowel preparation was                            good. The ileocecal valve, appendiceal orifice, and  rectum were photographed. Scope In: 9:47:38 AM Scope Out: 10:03:23 AM Scope Withdrawal Time: 0 hours 13 minutes 57 seconds  Total Procedure Duration: 0 hours 15 minutes 45 seconds  Findings:                 Four sessile polyps were found in the descending                            colon, transverse colon and ascending colon. The                            polyps were 2 to 6 mm in size. These polyps were                            removed with a cold snare. Resection and retrieval                            were complete.                           The exam was otherwise without abnormality on                            direct and retroflexion views.                           Multiple small and large-mouthed diverticula were                            found in the left colon. Complications:            No immediate complications. Estimated blood loss:                            None. Estimated Blood Loss:     Estimated blood loss: none. Impression:                - Four 2 to 6 mm polyps in the descending colon, in                            the transverse colon and in the ascending colon,                            removed with a cold snare. Resected and retrieved.                           - Diverticulosis.                           - The examination was otherwise normal on direct                            and retroflexion views. Recommendation:           - Patient has a contact number available for  emergencies. The signs and symptoms of potential                            delayed complications were discussed with the                            patient. Return to normal activities tomorrow.                            Written discharge instructions were provided to the                            patient.                           - Resume previous diet.                           - Continue present medications.                           You will receive a letter within 2-3 weeks with the                            pathology results and my final recommendations.                           If the polyp(s) is proven to be 'pre-cancerous' on                            pathology, you will need repeat colonoscopy in 3-5                            years. Milus Banister, MD 07/12/2016 10:06:29 AM This report has been signed electronically.

## 2016-07-13 ENCOUNTER — Telehealth: Payer: Self-pay | Admitting: *Deleted

## 2016-07-13 NOTE — Telephone Encounter (Signed)
  Follow up Call-  Call back number 07/12/2016  Post procedure Call Back phone  # [phone]  Permission to leave phone message Yes  Some recent data might be hidden     Patient questions:  Do you have a fever, pain , or abdominal swelling? No. Pain Score  0 *  Have you tolerated food without any problems? Yes.    Have you been able to return to your normal activities? Yes.    Do you have any questions about your discharge instructions: Diet   No. Medications  No. Follow up visit  No.  Do you have questions or concerns about your Care? No.  Actions: * If pain score is 4 or above: No action needed, pain <4.

## 2016-07-19 ENCOUNTER — Encounter: Payer: Self-pay | Admitting: Gastroenterology

## 2018-08-05 DIAGNOSIS — C4492 Squamous cell carcinoma of skin, unspecified: Secondary | ICD-10-CM

## 2018-08-05 HISTORY — DX: Squamous cell carcinoma of skin, unspecified: C44.92

## 2019-05-13 ENCOUNTER — Other Ambulatory Visit: Payer: Self-pay

## 2019-05-13 ENCOUNTER — Ambulatory Visit
Admission: RE | Admit: 2019-05-13 | Discharge: 2019-05-13 | Disposition: A | Discharge status: Home / Self Care | Payer: Medicare Other | Source: Ambulatory Visit | Attending: Sports Medicine | Admitting: Sports Medicine

## 2019-05-13 ENCOUNTER — Other Ambulatory Visit: Payer: Self-pay | Admitting: Sports Medicine

## 2019-05-13 DIAGNOSIS — M542 Cervicalgia: Secondary | ICD-10-CM

## 2019-05-13 IMAGING — MR MR CERVICAL SPINE W/O CM
4 of 5 series · 28 of 48 positions shown · non-contrast
Comparison: None.

CLINICAL DATA: Fifth left neck pain with numbness in the left thumb
and index finger. Pain in the left shoulder and arm.

EXAM:
MRI CERVICAL SPINE WITHOUT CONTRAST
TECHNIQUE: Multiplanar, multisequence MR imaging of the cervical spine was
performed. No intravenous contrast was administered.

[Series 3: tir sag · sagittal · 3.0mm · 0.41mm/px · 6 of 13 slices shown]
[im 1/13]
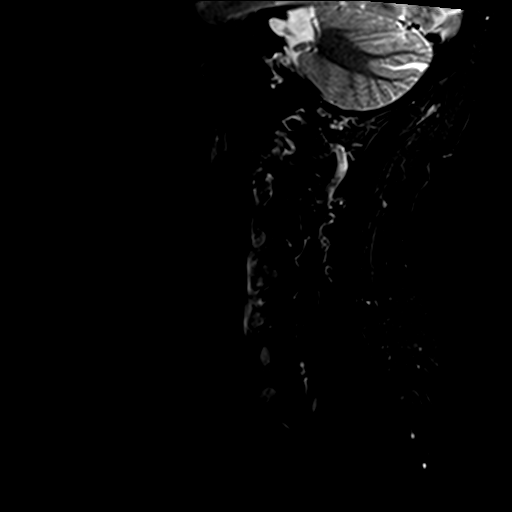
[im 3/13]
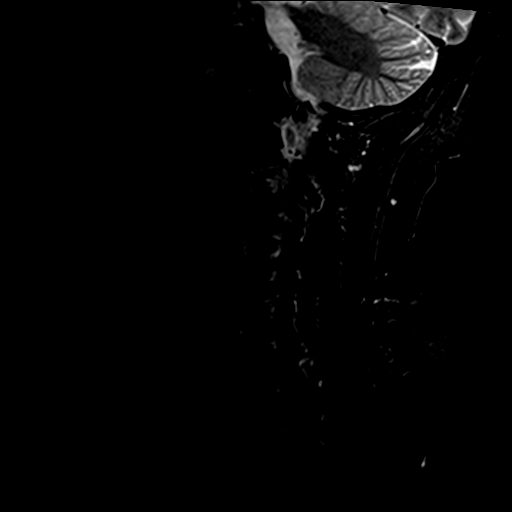
[im 5/13]
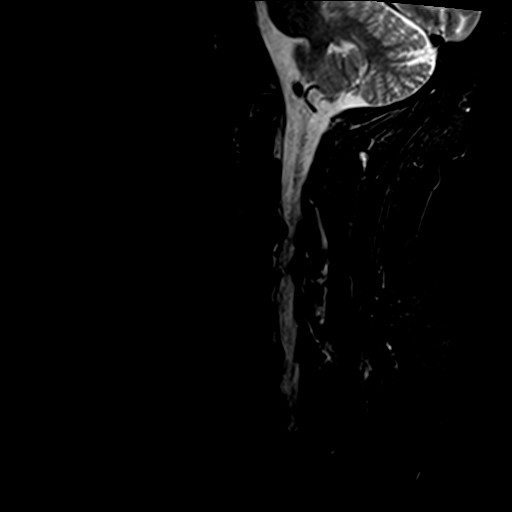
[im 8/13]
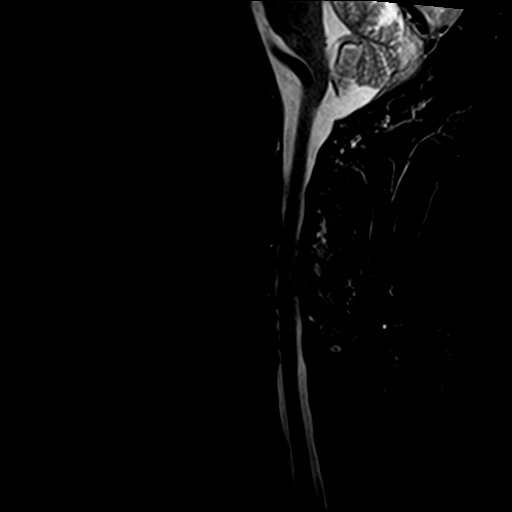
[im 10/13]
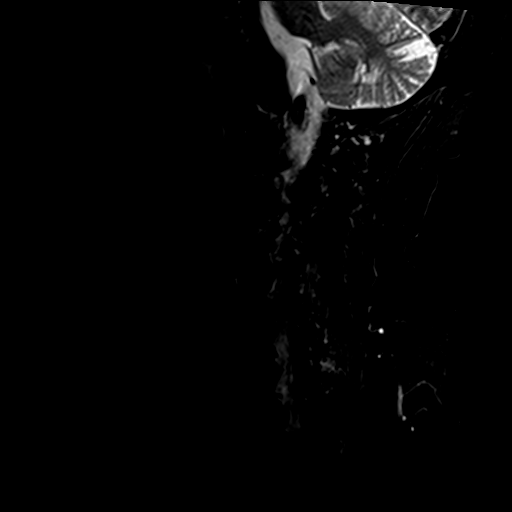
[im 13/13]
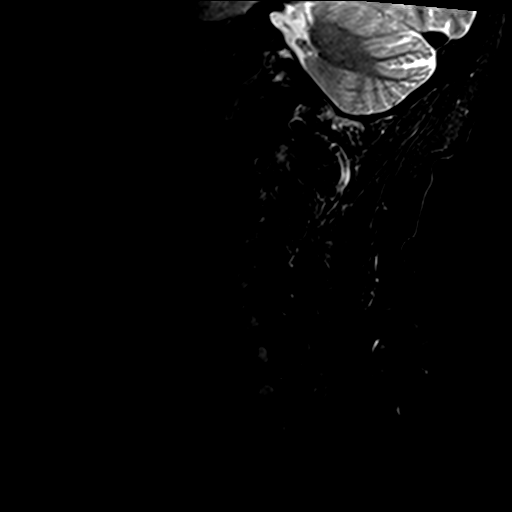

[Series 4: T1 · sagittal · 3.0mm · 0.41mm/px · 7 of 13 slices shown]
[im 1/13]
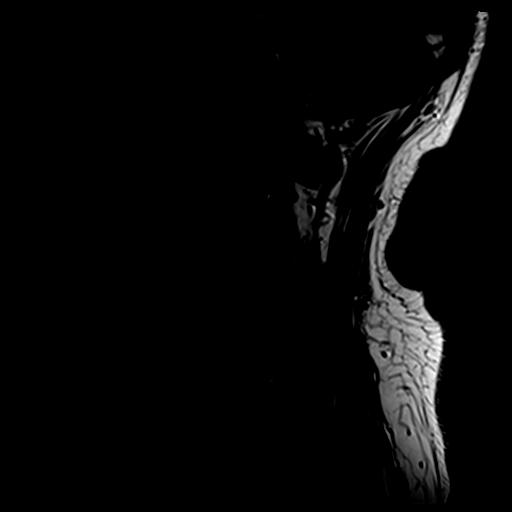
[im 3/13]
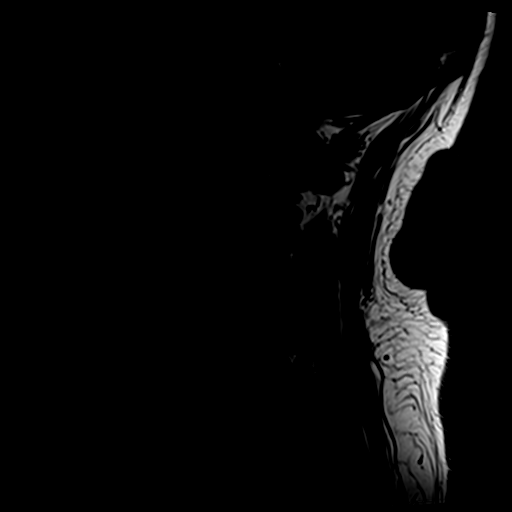
[im 5/13]
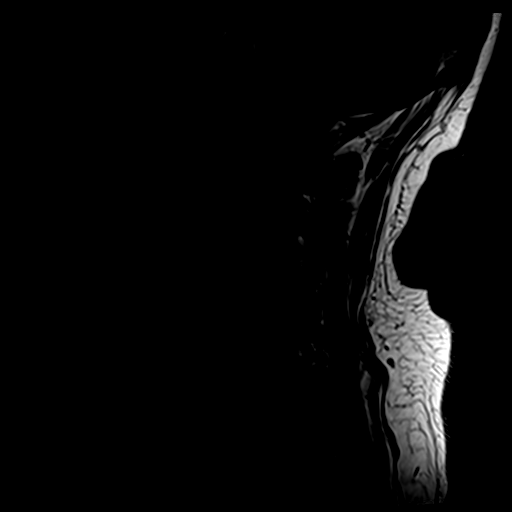
[im 7/13]
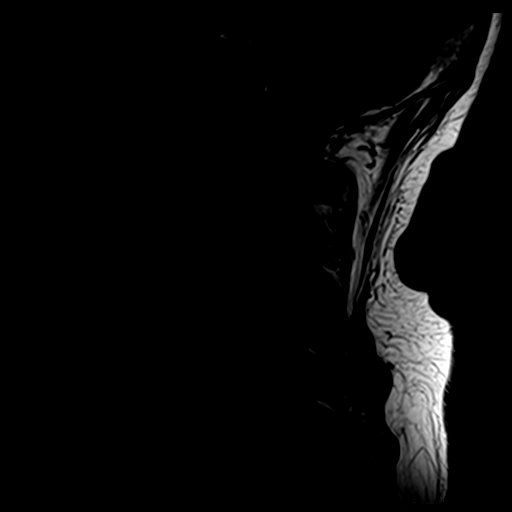
[im 9/13]
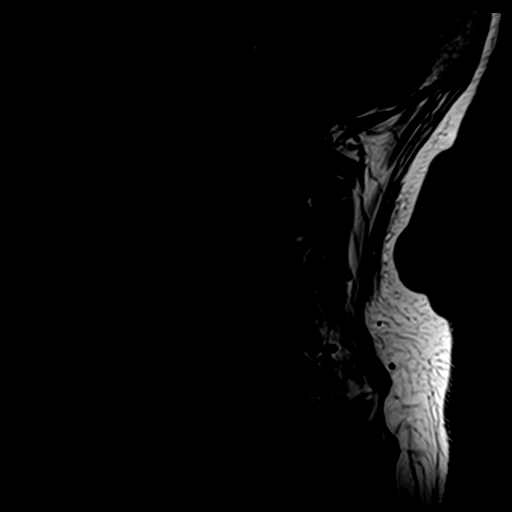
[im 11/13]
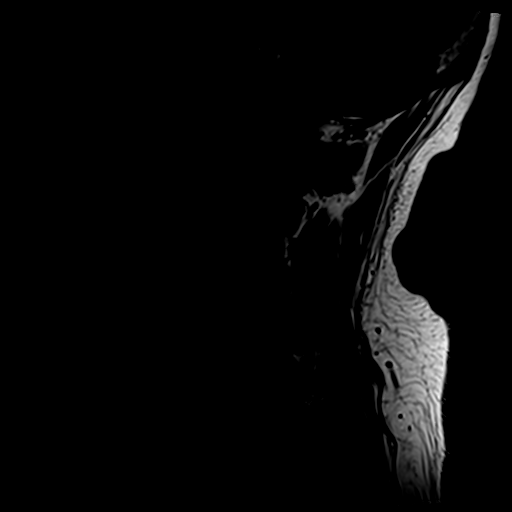
[im 13/13]
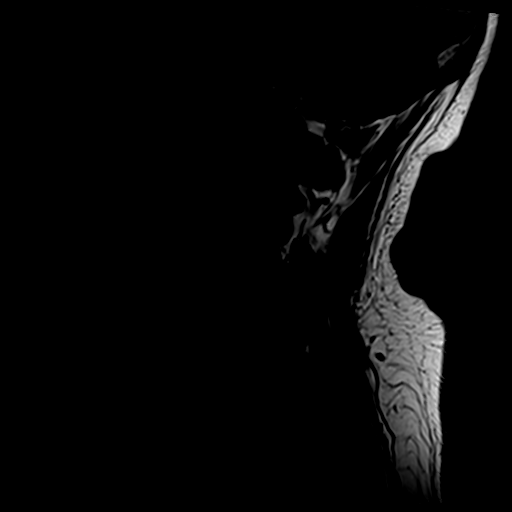

[Series 5: T2 · sagittal · 3.0mm · 0.66mm/px · 7 of 13 slices shown (1 of 2)]
[im 1/13]
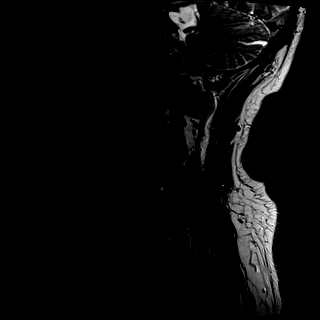
[im 3/13]
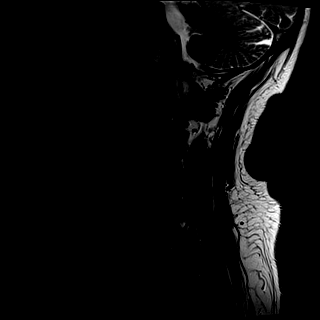
[im 5/13]
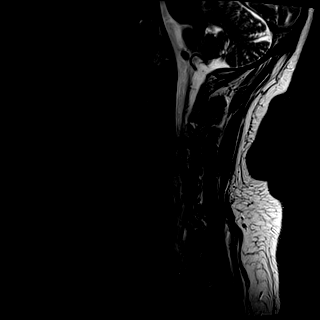
[im 7/13]
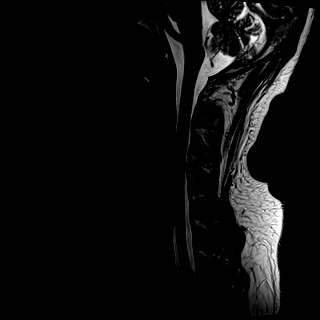
[im 9/13]
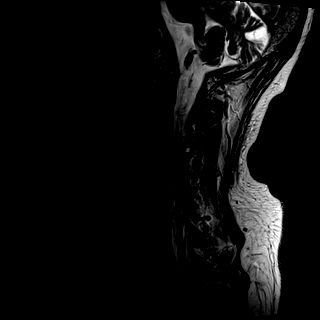
[im 11/13]
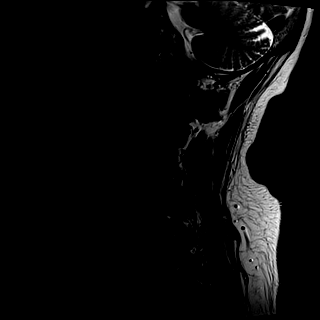
[im 13/13]
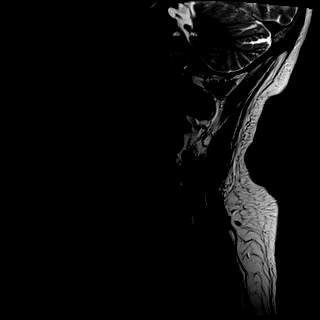

[Series 7: T2 · axial · 3.0mm · 0.70mm/px · z∈[-86,+16]mm · 8 of 28 slices shown (2 of 2)]
[im 1/28]
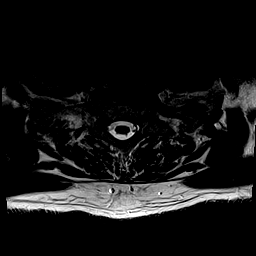
[im 5/28]
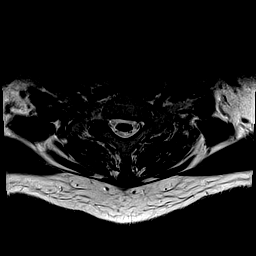
[im 9/28]
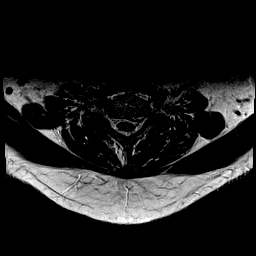
[im 13/28]
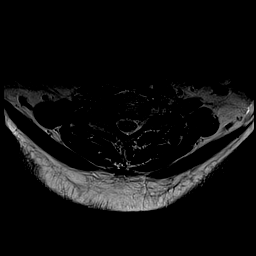
[im 15/28]
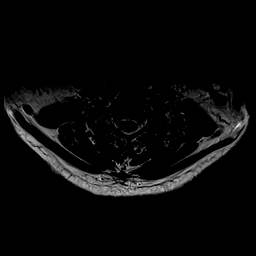
[im 19/28]
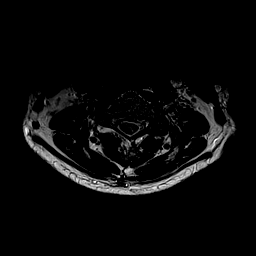
[im 23/28]
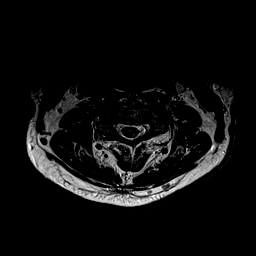
[im 28/28]
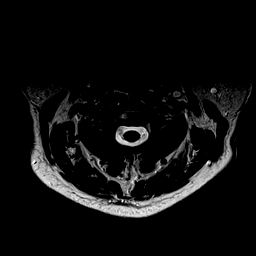

[28 of 48 positions shown; findings below may reference images not displayed]

FINDINGS: Alignment: There is straightening of the normal cervical lordosis.
0.2 cm retrolisthesis C3 on C4 is seen

Vertebrae: No fracture, evidence of discitis, or bone lesion.

Cord: Normal signal throughout.

Posterior Fossa, vertebral arteries, paraspinal tissues: Negative.

Disc levels:

C2-3: Mild facet degenerative disease and a shallow right
paracentral protrusion. No stenosis.

C3-4: Moderate facet degenerative change on the left. There is a
shallow disc bulge and left worse than right uncovertebral disease.
The ventral thecal sac is effaced. Moderately severe to severe
foraminal narrowing is worse on the left.

C4-5: Bilateral facet and uncovertebral degeneration and a
broad-based disc bulge are seen. The ventral thecal sac is effaced.
Severe bilateral foraminal narrowing is present.

C5-6: Right worse than left facet arthropathy and uncovertebral
spurring. Minimal disc bulge. The central canal is open. Severe
right and moderate left foraminal narrowing.

C6-7: There is a shallow left paracentral protrusion and left much
worse than right uncovertebral disease. The patient's protrusion
could impact the left C7 root as it enters the foramen in the left
foramen is markedly narrowed. Moderate right foraminal narrowing is
also present.

C7-T1: Negative.
IMPRESSION: 1. Moderately severe to severe foraminal narrowing at C3-4 due to
uncovertebral disease is worse on the left.
2. Severe bilateral foraminal narrowing at C4-5 due to uncovertebral
disease and facet arthropathy. The ventral thecal sac is effaced at
this level.
3. Severe right and moderate left foraminal narrowing C5-6 due to
uncovertebral disease.
4. Shallow left paracentral protrusion at C6-7 encroaches on the
left C7 root as it enters the foramen. Moderate to moderately severe
foraminal narrowing at this level is worse on the left.

## 2019-05-20 ENCOUNTER — Other Ambulatory Visit: Payer: Self-pay | Admitting: Sports Medicine

## 2019-05-20 DIAGNOSIS — M542 Cervicalgia: Secondary | ICD-10-CM

## 2019-06-09 ENCOUNTER — Other Ambulatory Visit: Payer: Self-pay | Admitting: Neurosurgery

## 2019-06-09 DIAGNOSIS — R29898 Other symptoms and signs involving the musculoskeletal system: Secondary | ICD-10-CM

## 2019-06-10 ENCOUNTER — Ambulatory Visit
Admission: RE | Admit: 2019-06-10 | Discharge: 2019-06-10 | Disposition: A | Discharge status: Home / Self Care | Payer: Medicare Other | Source: Ambulatory Visit | Attending: Neurosurgery | Admitting: Neurosurgery

## 2019-06-10 ENCOUNTER — Other Ambulatory Visit: Payer: Self-pay

## 2019-06-10 DIAGNOSIS — R29898 Other symptoms and signs involving the musculoskeletal system: Secondary | ICD-10-CM

## 2019-06-10 IMAGING — MR MR SHOULDER*L* W/O CM
5 series · 36 of 40 positions shown · non-contrast
Comparison: Plain films left shoulder [DATE].

CLINICAL DATA: Left arm numbness and weakness since [DATE] when
the patient fell. Subsequent encounter.

EXAM:
MRI OF THE LEFT SHOULDER WITHOUT CONTRAST
TECHNIQUE: Multiplanar, multisequence MR imaging of the shoulder was performed.
No intravenous contrast was administered.

[Series 3: PD fat-sat · axial · 4.0mm · 0.55mm/px · z∈[-44,+46]mm · 8 of 20 slices shown (1 of 2)]
[im 1/20]
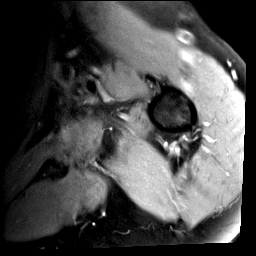
[im 3/20]
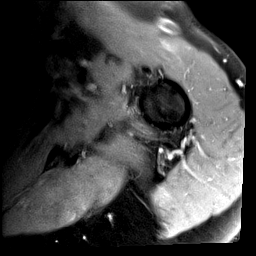
[im 6/20]
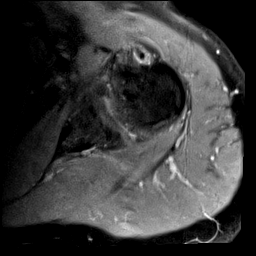
[im 9/20]
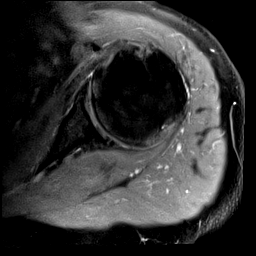
[im 11/20]
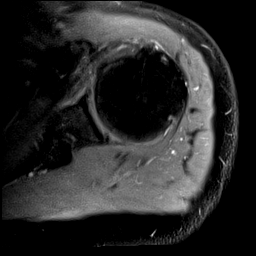
[im 14/20]
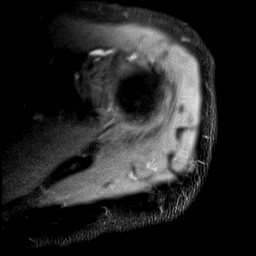
[im 17/20]
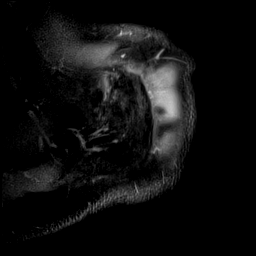
[im 20/20]
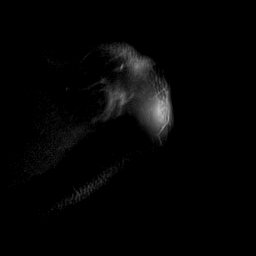

[Series 4: T2 fat-sat · oblique · 4.0mm · 0.55mm/px · 7 of 18 slices shown (1 of 2)]
[im 1/18]
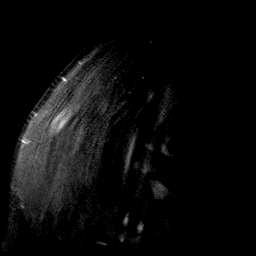
[im 3/18]
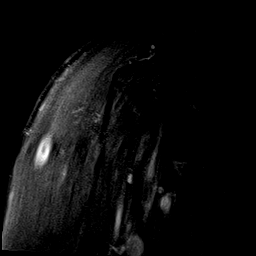
[im 6/18]
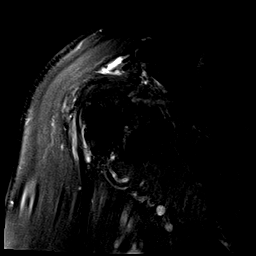
[im 9/18]
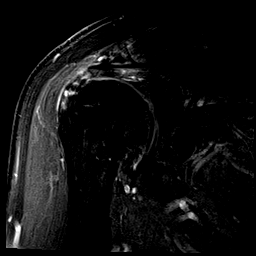
[im 12/18]
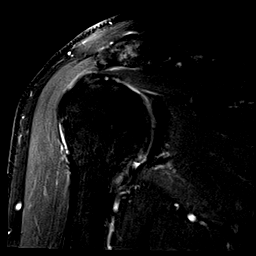
[im 15/18]
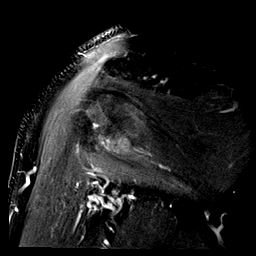
[im 18/18]
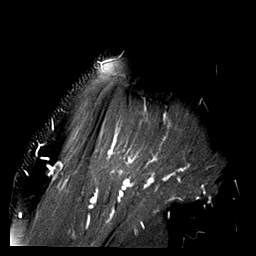

[Series 5: PD fat-sat · oblique · 4.0mm · 0.27mm/px · 7 of 18 slices shown (2 of 2)]
[im 1/18]
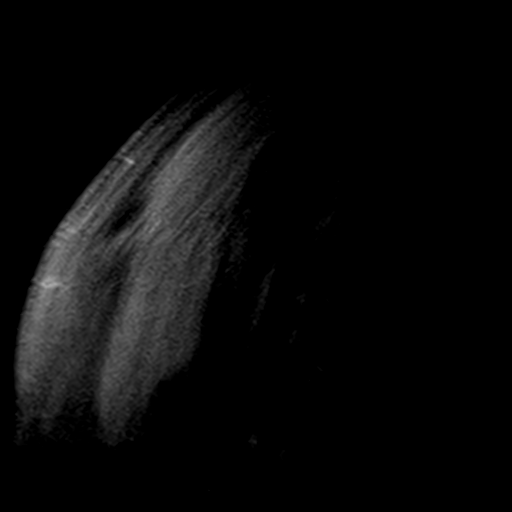
[im 3/18]
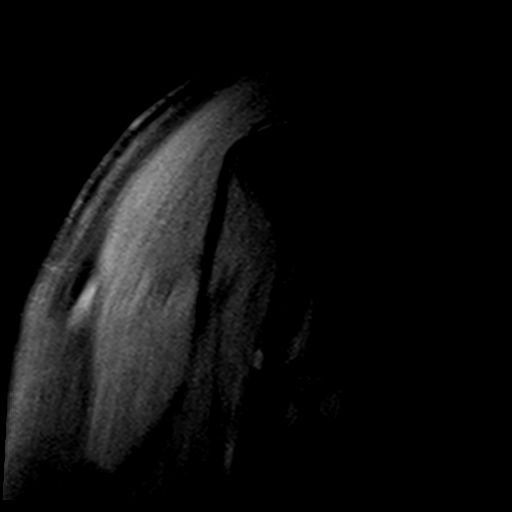
[im 6/18]
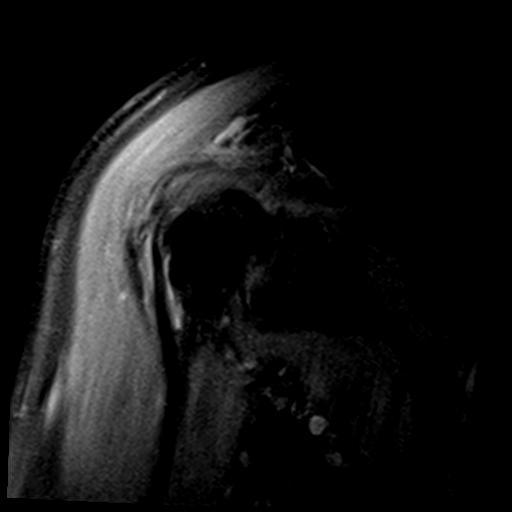
[im 9/18]
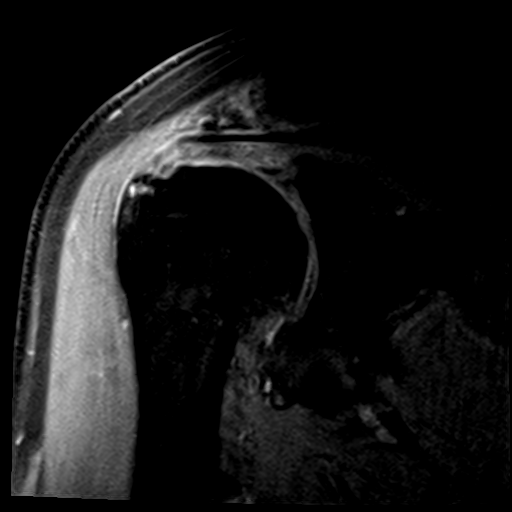
[im 12/18]
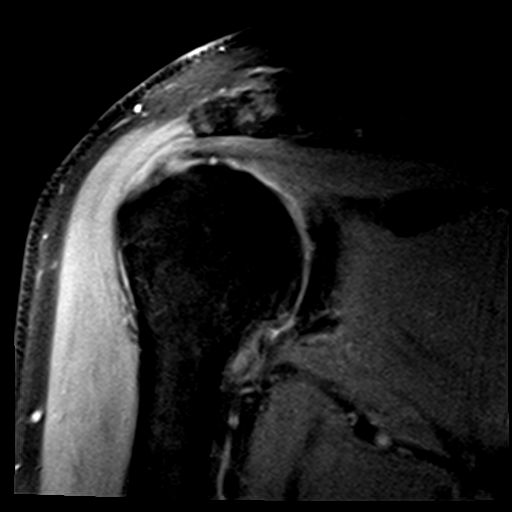
[im 15/18]
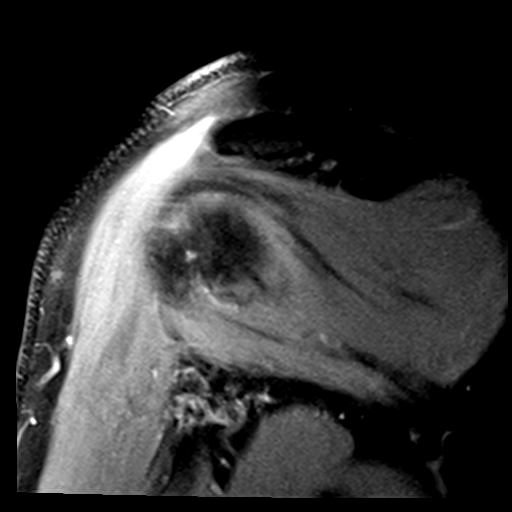
[im 18/18]
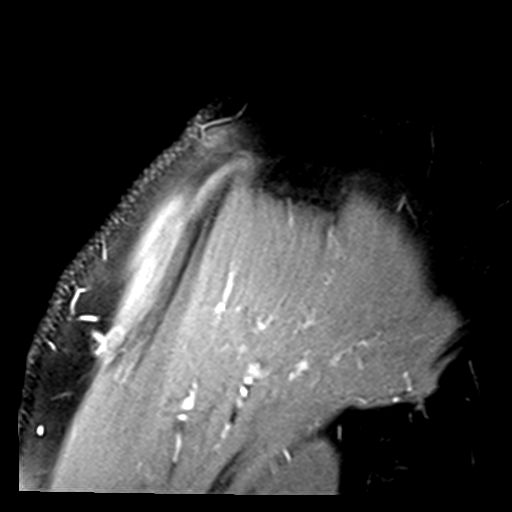

[Series 6: T1 · coronal · 4.0mm · 0.27mm/px · 5 of 22 slices shown]
[im 1/22]
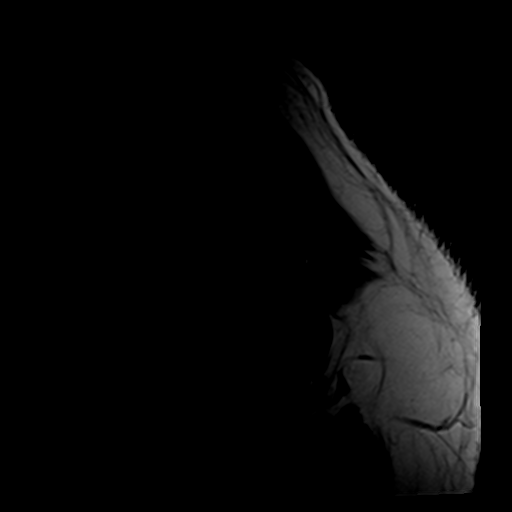
[im 3/22]
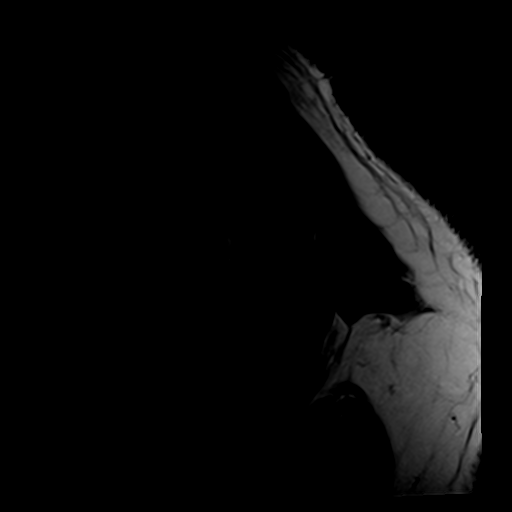
[im 6/22]
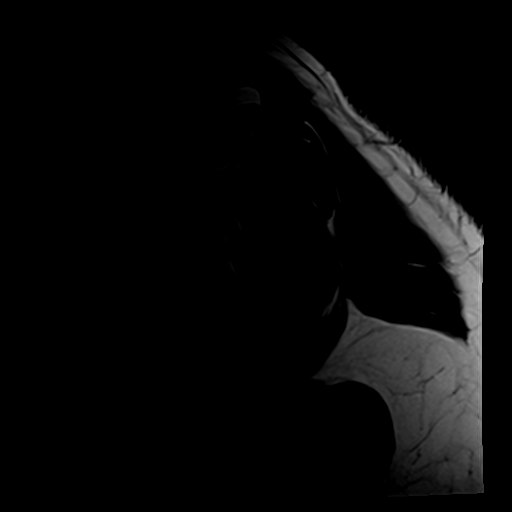
[im 8/22]
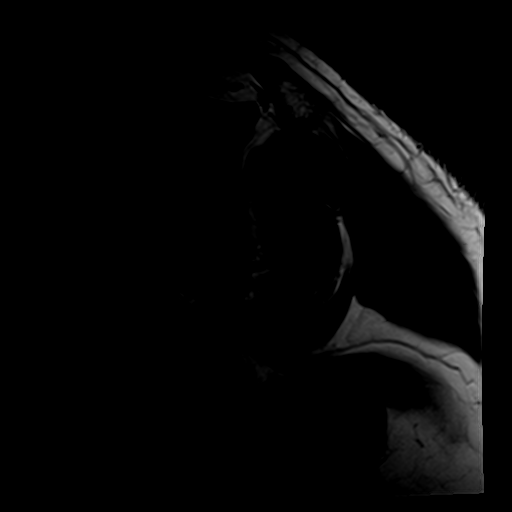
[im 14/22]
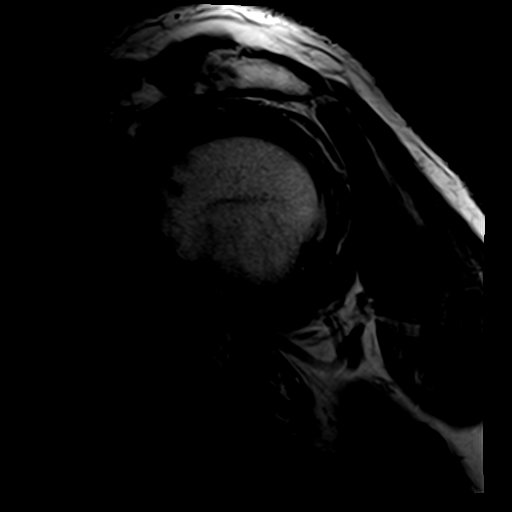

[Series 7: T2 fat-sat · coronal · 4.0mm · 0.55mm/px · 9 of 22 slices shown (2 of 2)]
[im 1/22]
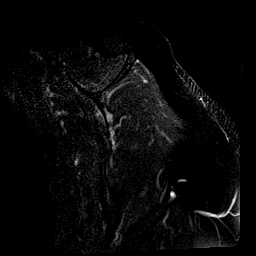
[im 3/22]
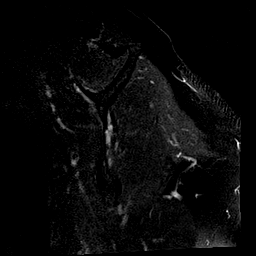
[im 6/22]
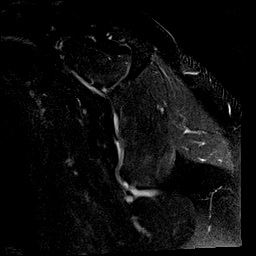
[im 8/22]
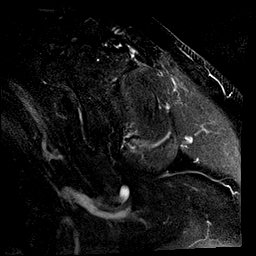
[im 11/22]
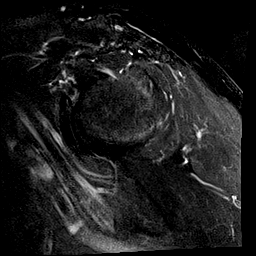
[im 14/22]
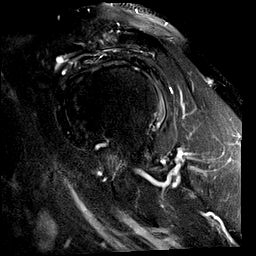
[im 16/22]
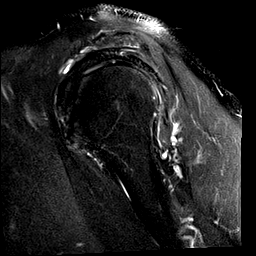
[im 19/22]
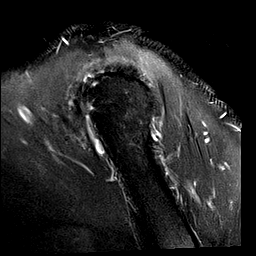
[im 22/22]
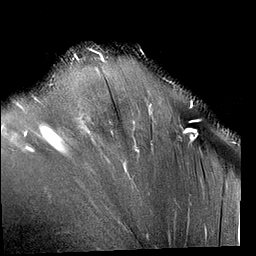

[36 of 40 positions shown; findings below may reference images not displayed]

FINDINGS: Rotator cuff: The patient has rotator cuff tendinopathy. There is an
undersurface tear of the anterior and far lateral supraspinatus
measuring 0.4 cm from front to back with retraction of approximately
1 cm.

Muscles:  No atrophy or focal lesion.

Biceps long head:  Intact.

Acromioclavicular Joint: Moderate osteoarthritis. Type 2 acromion.
No subacromial/subdeltoid bursal fluid.

Glenohumeral Joint: Mild-to-moderate degenerative change is seen
with cartilage thinning and a small osteophyte off the humeral head.

Labrum:  Degenerated without tear.

Bones:  No fracture, contusion or focal lesion.

Other: None.
IMPRESSION: 1. Rotator cuff tendinopathy with a 0.4 cm from front to back
undersurface tear of the anterior and far lateral supraspinatus with
approximately 1 cm of retraction but no atrophy.
2. Moderate acromioclavicular and mild glenohumeral osteoarthritis.

## 2019-06-13 HISTORY — PX: CARPAL TUNNEL RELEASE: SHX101

## 2019-06-13 HISTORY — PX: POLYPECTOMY: SHX149

## 2019-06-16 DIAGNOSIS — M79642 Pain in left hand: Secondary | ICD-10-CM | POA: Diagnosis not present

## 2019-06-16 DIAGNOSIS — M75112 Incomplete rotator cuff tear or rupture of left shoulder, not specified as traumatic: Secondary | ICD-10-CM | POA: Diagnosis not present

## 2019-06-23 DIAGNOSIS — G5602 Carpal tunnel syndrome, left upper limb: Secondary | ICD-10-CM | POA: Diagnosis not present

## 2019-06-23 DIAGNOSIS — M25532 Pain in left wrist: Secondary | ICD-10-CM | POA: Diagnosis not present

## 2019-06-23 DIAGNOSIS — M4802 Spinal stenosis, cervical region: Secondary | ICD-10-CM | POA: Diagnosis not present

## 2019-06-23 DIAGNOSIS — Z6830 Body mass index (BMI) 30.0-30.9, adult: Secondary | ICD-10-CM | POA: Diagnosis not present

## 2019-06-23 DIAGNOSIS — M5412 Radiculopathy, cervical region: Secondary | ICD-10-CM | POA: Diagnosis not present

## 2019-06-23 DIAGNOSIS — M79602 Pain in left arm: Secondary | ICD-10-CM | POA: Diagnosis not present

## 2019-06-23 DIAGNOSIS — I10 Essential (primary) hypertension: Secondary | ICD-10-CM | POA: Diagnosis not present

## 2019-06-25 ENCOUNTER — Other Ambulatory Visit: Payer: Self-pay | Admitting: Neurosurgery

## 2019-06-25 DIAGNOSIS — M25532 Pain in left wrist: Secondary | ICD-10-CM

## 2019-07-02 DIAGNOSIS — Z6830 Body mass index (BMI) 30.0-30.9, adult: Secondary | ICD-10-CM | POA: Diagnosis not present

## 2019-07-02 DIAGNOSIS — I10 Essential (primary) hypertension: Secondary | ICD-10-CM | POA: Diagnosis not present

## 2019-07-02 DIAGNOSIS — S63592A Other specified sprain of left wrist, initial encounter: Secondary | ICD-10-CM | POA: Diagnosis not present

## 2019-07-02 DIAGNOSIS — M25532 Pain in left wrist: Secondary | ICD-10-CM | POA: Diagnosis not present

## 2019-07-05 ENCOUNTER — Other Ambulatory Visit: Payer: Medicare Other

## 2019-07-09 DIAGNOSIS — G5602 Carpal tunnel syndrome, left upper limb: Secondary | ICD-10-CM | POA: Diagnosis not present

## 2019-07-09 DIAGNOSIS — M1812 Unilateral primary osteoarthritis of first carpometacarpal joint, left hand: Secondary | ICD-10-CM | POA: Diagnosis not present

## 2019-07-09 DIAGNOSIS — M5412 Radiculopathy, cervical region: Secondary | ICD-10-CM | POA: Diagnosis not present

## 2019-08-04 DIAGNOSIS — G5602 Carpal tunnel syndrome, left upper limb: Secondary | ICD-10-CM | POA: Diagnosis not present

## 2019-08-04 DIAGNOSIS — M1812 Unilateral primary osteoarthritis of first carpometacarpal joint, left hand: Secondary | ICD-10-CM | POA: Diagnosis not present

## 2019-08-13 DIAGNOSIS — M1812 Unilateral primary osteoarthritis of first carpometacarpal joint, left hand: Secondary | ICD-10-CM | POA: Diagnosis not present

## 2019-08-13 DIAGNOSIS — Z01818 Encounter for other preprocedural examination: Secondary | ICD-10-CM | POA: Diagnosis not present

## 2019-08-13 DIAGNOSIS — G5602 Carpal tunnel syndrome, left upper limb: Secondary | ICD-10-CM | POA: Diagnosis not present

## 2019-08-19 DIAGNOSIS — Z01812 Encounter for preprocedural laboratory examination: Secondary | ICD-10-CM | POA: Diagnosis not present

## 2019-08-19 DIAGNOSIS — Z20822 Contact with and (suspected) exposure to covid-19: Secondary | ICD-10-CM | POA: Diagnosis not present

## 2019-08-21 DIAGNOSIS — I10 Essential (primary) hypertension: Secondary | ICD-10-CM | POA: Diagnosis not present

## 2019-08-21 DIAGNOSIS — M189 Osteoarthritis of first carpometacarpal joint, unspecified: Secondary | ICD-10-CM | POA: Diagnosis not present

## 2019-08-21 DIAGNOSIS — J45909 Unspecified asthma, uncomplicated: Secondary | ICD-10-CM | POA: Diagnosis not present

## 2019-08-21 DIAGNOSIS — G5602 Carpal tunnel syndrome, left upper limb: Secondary | ICD-10-CM | POA: Diagnosis not present

## 2019-08-21 DIAGNOSIS — K219 Gastro-esophageal reflux disease without esophagitis: Secondary | ICD-10-CM | POA: Diagnosis not present

## 2019-08-21 DIAGNOSIS — M1812 Unilateral primary osteoarthritis of first carpometacarpal joint, left hand: Secondary | ICD-10-CM | POA: Diagnosis not present

## 2019-09-04 DIAGNOSIS — M1812 Unilateral primary osteoarthritis of first carpometacarpal joint, left hand: Secondary | ICD-10-CM | POA: Diagnosis not present

## 2019-09-04 DIAGNOSIS — Z4889 Encounter for other specified surgical aftercare: Secondary | ICD-10-CM | POA: Diagnosis not present

## 2019-10-06 DIAGNOSIS — M720 Palmar fascial fibromatosis [Dupuytren]: Secondary | ICD-10-CM | POA: Diagnosis not present

## 2019-10-06 DIAGNOSIS — Z4889 Encounter for other specified surgical aftercare: Secondary | ICD-10-CM | POA: Diagnosis not present

## 2019-10-10 DIAGNOSIS — M5416 Radiculopathy, lumbar region: Secondary | ICD-10-CM | POA: Diagnosis not present

## 2019-10-10 DIAGNOSIS — M48061 Spinal stenosis, lumbar region without neurogenic claudication: Secondary | ICD-10-CM | POA: Diagnosis not present

## 2019-10-13 ENCOUNTER — Other Ambulatory Visit: Payer: Self-pay

## 2019-10-13 ENCOUNTER — Ambulatory Visit: Payer: Medicare PPO | Admitting: Dermatology

## 2019-10-13 ENCOUNTER — Encounter: Payer: Self-pay | Admitting: Dermatology

## 2019-10-13 DIAGNOSIS — Z86007 Personal history of in-situ neoplasm of skin: Secondary | ICD-10-CM

## 2019-10-13 DIAGNOSIS — D692 Other nonthrombocytopenic purpura: Secondary | ICD-10-CM

## 2019-10-13 DIAGNOSIS — L820 Inflamed seborrheic keratosis: Secondary | ICD-10-CM

## 2019-10-13 DIAGNOSIS — L570 Actinic keratosis: Secondary | ICD-10-CM | POA: Diagnosis not present

## 2019-10-13 DIAGNOSIS — L578 Other skin changes due to chronic exposure to nonionizing radiation: Secondary | ICD-10-CM | POA: Diagnosis not present

## 2019-10-13 NOTE — Progress Notes (Signed)
   Follow-Up Visit   Subjective  Adam Sherman is a 74 y.o. male who presents for the following: SCCIS recheck (Right dorsum hand 08/05/18 with a topical cream) and Recheck AK's (face, scalp, and ears - treated with LN2 ).  The following portions of the chart were reviewed this encounter and updated as appropriate:  Tobacco  Allergies  Meds  Problems  Med Hx  Surg Hx  Fam Hx     Review of Systems:  No other skin or systemic complaints except as noted in HPI or Assessment and Plan.  Objective  Well appearing patient in no apparent distress; mood and affect are within normal limits.  A focused examination was performed including arms and hands. Relevant physical exam findings are noted in the Assessment and Plan.  Objective  Scalp, ears, face x 21 (21): Erythematous thin papules/macules with gritty scale.   Objective  B/L arms and hands x 16 (16): Erythematous keratotic or waxy stuck-on papule or plaque.    Assessment & Plan  AK (actinic keratosis) (21) Scalp, ears, face x 21  Destruction of lesion - Scalp, ears, face x 21 Complexity: simple   Destruction method: cryotherapy   Informed consent: discussed and consent obtained   Timeout:  patient name, date of birth, surgical site, and procedure verified Lesion destroyed using liquid nitrogen: Yes   Region frozen until ice ball extended beyond lesion: Yes   Outcome: patient tolerated procedure well with no complications   Post-procedure details: wound care instructions given    Inflamed seborrheic keratosis (16) B/L arms and hands x 16  Destruction of lesion - B/L arms and hands x 16 Complexity: simple   Destruction method: cryotherapy   Informed consent: discussed and consent obtained   Timeout:  patient name, date of birth, surgical site, and procedure verified Lesion destroyed using liquid nitrogen: Yes   Region frozen until ice ball extended beyond lesion: Yes   Outcome: patient tolerated procedure well with  no complications   Post-procedure details: wound care instructions given     Actinic Damage - diffuse scaly erythematous macules with underlying dyspigmentation - Recommend daily broad spectrum sunscreen SPF 30+ to sun-exposed areas, reapply every 2 hours as needed.  - Call for new or changing lesions.  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  - Call if any new or changing lesions are noted between office visits  Purpura - Violaceous macules and patches - Benign - Related to age, sun damage and/or use of blood thinners - Observe - Can use OTC arnica containing moisturizer such as Dermend Bruise Formula if desired - Call for worsening or other concerns  Return in about 3 months (around 01/13/2020).  Luther Redo, CMA, am acting as scribe for Sarina Ser, MD .  Documentation: I have reviewed the above documentation for accuracy and completeness, and I agree with the above.  Sarina Ser, MD

## 2019-12-29 ENCOUNTER — Other Ambulatory Visit: Payer: Self-pay | Admitting: Neurosurgery

## 2019-12-29 DIAGNOSIS — M5416 Radiculopathy, lumbar region: Secondary | ICD-10-CM

## 2020-01-16 ENCOUNTER — Ambulatory Visit
Admission: RE | Admit: 2020-01-16 | Discharge: 2020-01-16 | Disposition: A | Discharge status: Home / Self Care | Payer: Medicare PPO | Source: Ambulatory Visit | Attending: Neurosurgery | Admitting: Neurosurgery

## 2020-01-16 ENCOUNTER — Other Ambulatory Visit: Payer: Self-pay

## 2020-01-16 DIAGNOSIS — M48061 Spinal stenosis, lumbar region without neurogenic claudication: Secondary | ICD-10-CM | POA: Diagnosis not present

## 2020-01-16 DIAGNOSIS — M5416 Radiculopathy, lumbar region: Secondary | ICD-10-CM

## 2020-01-16 DIAGNOSIS — M5126 Other intervertebral disc displacement, lumbar region: Secondary | ICD-10-CM | POA: Diagnosis not present

## 2020-01-16 IMAGING — MR MR LUMBAR SPINE WO/W CM
5 of 7 series · 23 of 48 positions shown · IV contrast (multihance)
Comparison: [DATE]

CLINICAL DATA: Lumbar radiculopathy. Bilateral leg pain and
weakness for 3 months. Left leg numbness. Prior lumbar surgery.

EXAM:
MRI LUMBAR SPINE WITHOUT AND WITH CONTRAST
TECHNIQUE: Multiplanar and multiecho pulse sequences of the lumbar spine were
obtained without and with intravenous contrast.
CONTRAST:  19mL MULTIHANCE GADOBENATE DIMEGLUMINE 529 MG/ML IV SOLN

[Series 6: T1 · sagittal · 4.0mm · 0.73mm/px · 3 of 13 slices shown (1 of 2)]
[im 1/13]
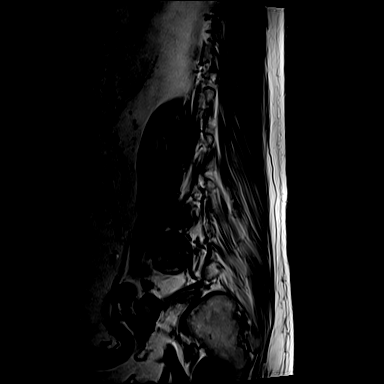
[im 7/13]
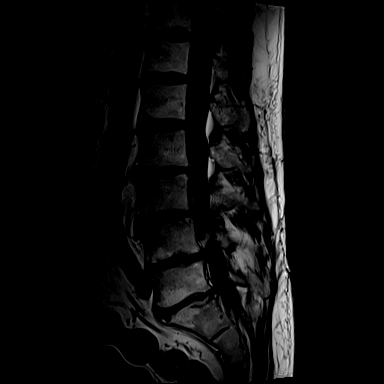
[im 13/13]
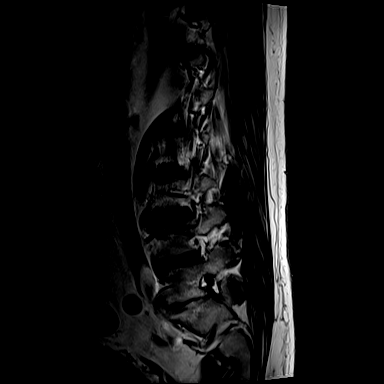

[Series 10: T2 · axial · 4.0mm · 0.35mm/px · z∈[+50,+255]mm · 11 of 39 slices shown (1 of 2)]
[im 1/39]
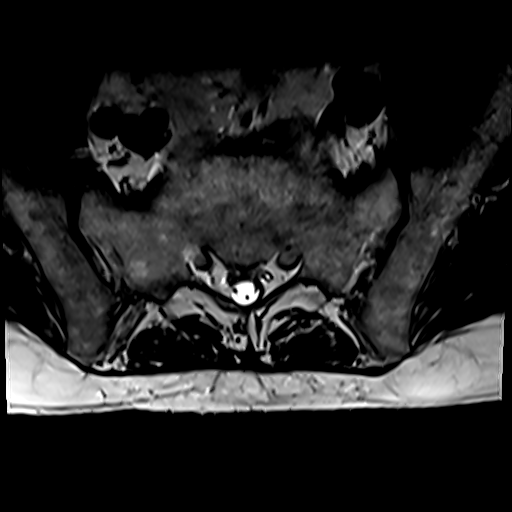
[im 4/39]
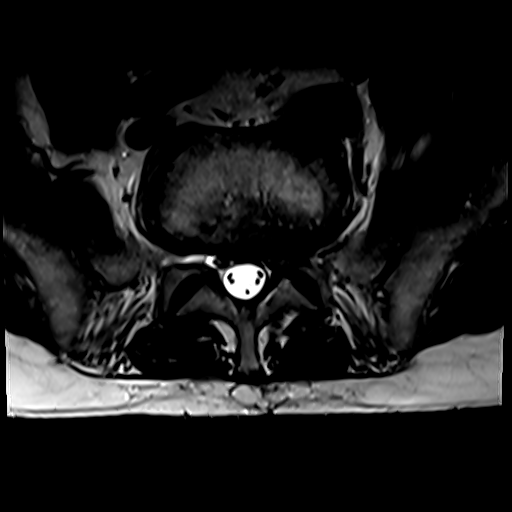
[im 8/39]
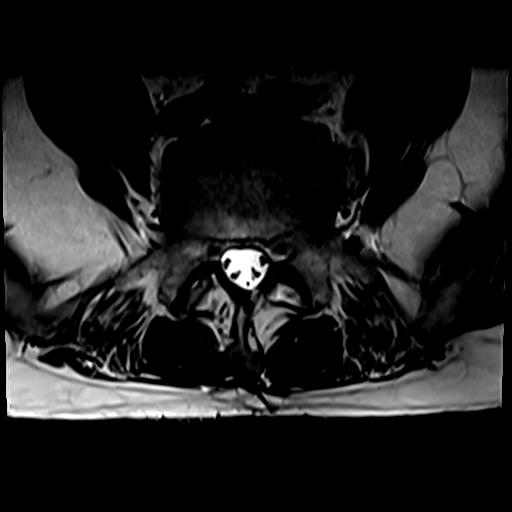
[im 12/39]
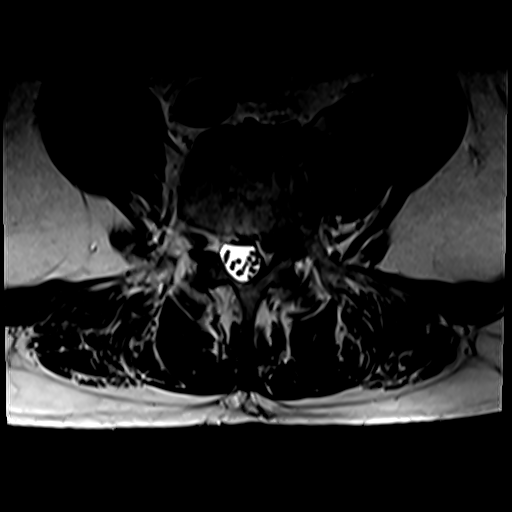
[im 16/39]
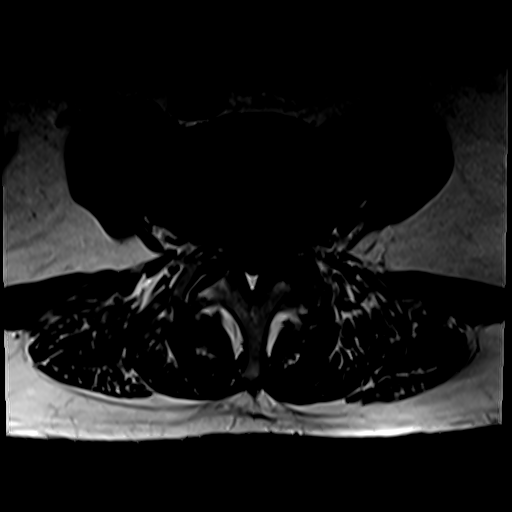
[im 20/39]
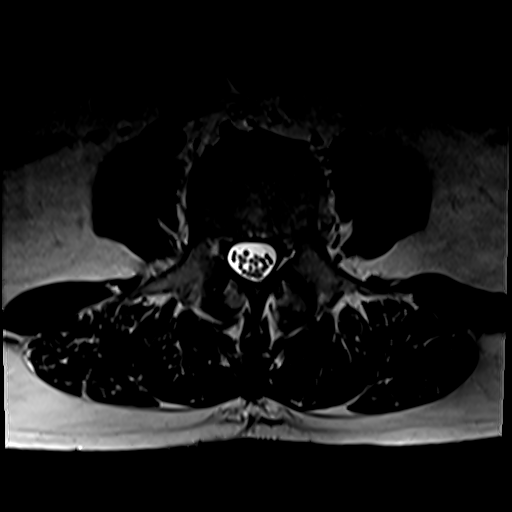
[im 23/39]
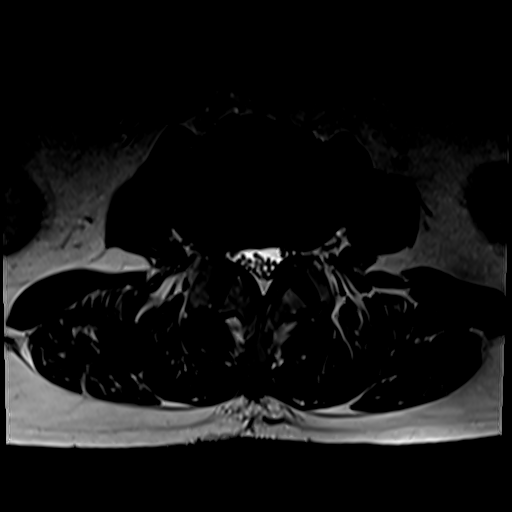
[im 27/39]
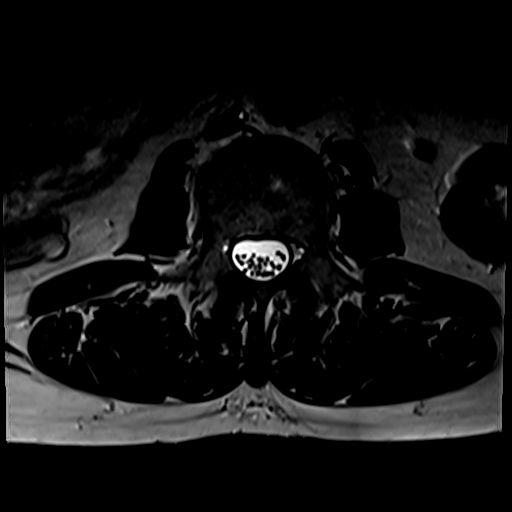
[im 31/39]
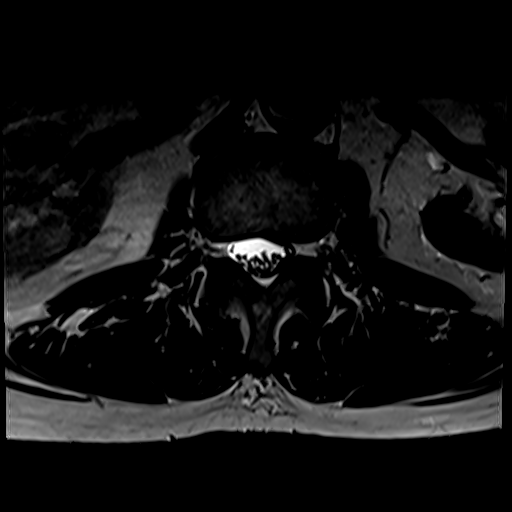
[im 35/39]
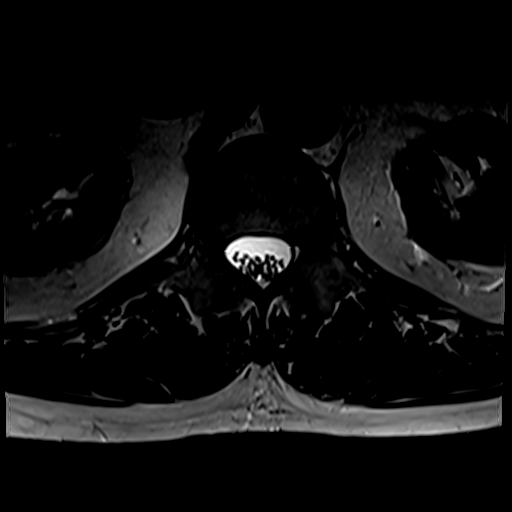
[im 39/39]
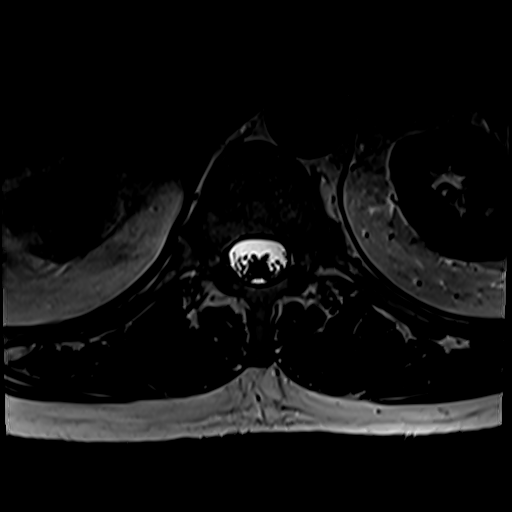

[Series 13: T2 · sagittal · 4.0mm · 0.73mm/px · 4 of 13 slices shown (2 of 2)]
[im 1/13]
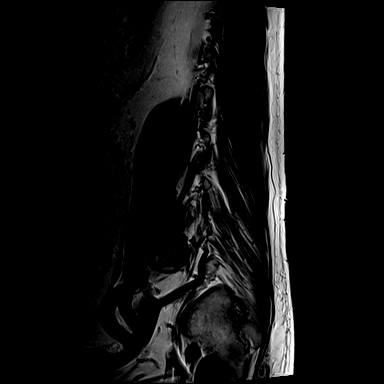
[im 5/13]
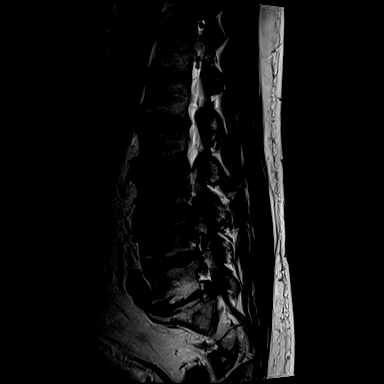
[im 9/13]
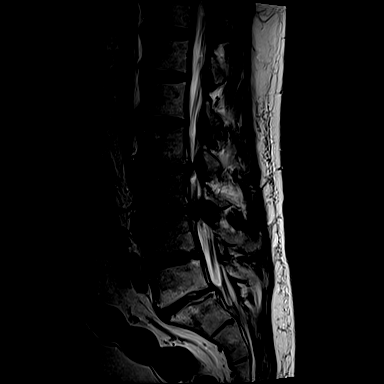
[im 13/13]
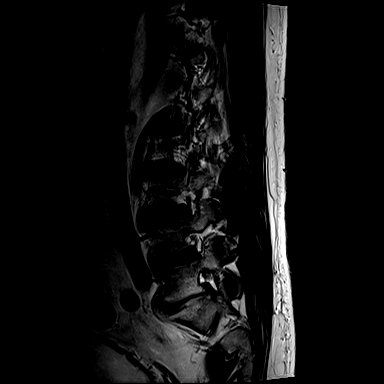

[Series 14: T1 fat-sat post-contrast · sagittal · 4.0mm · 0.73mm/px · 4 of 13 slices shown]
[im 1/13]
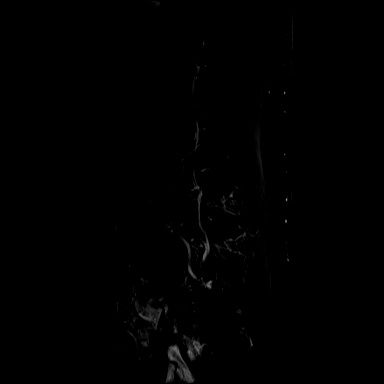
[im 5/13]
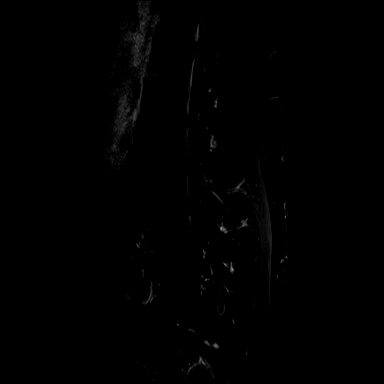
[im 9/13]
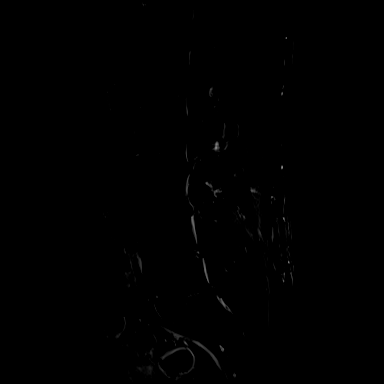
[im 13/13]
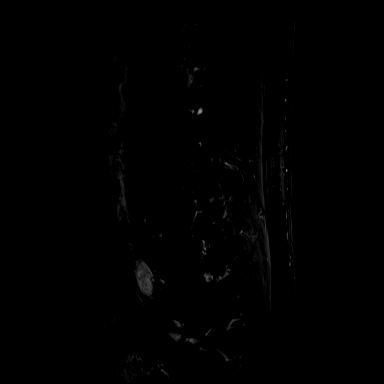

[Series 100: T1 · axial · 4.0mm · 0.35mm/px · 1 of 39 slices shown (2 of 2)]
[im 1/39]
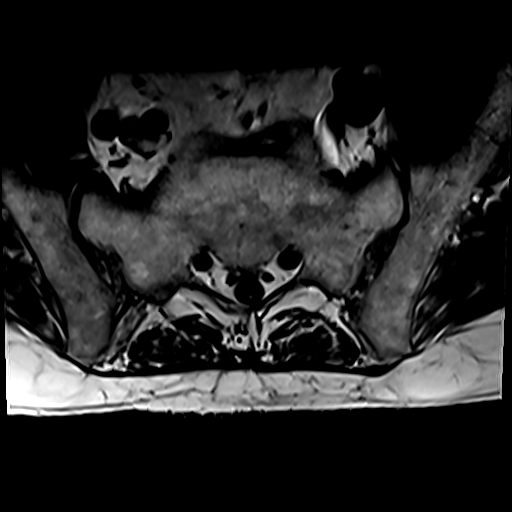

[23 of 48 positions shown; findings below may reference images not displayed]

FINDINGS: Segmentation:  Standard.

Alignment:  Normal.

Vertebrae: No fracture, suspicious osseous lesion, or significant
marrow edema. Modic type 2 endplate changes at L5-S1 greater than
L4-5.

Conus medullaris and cauda equina: Conus extends to the L1 level.
Conus and cauda equina appear normal.

Paraspinal and other soft tissues: 1 cm right renal cyst.

Disc levels:

Disc desiccation from L2-3 to L5-S1 with associated moderate disc
space narrowing, greatest at L5-S1.

T12-L1: Negative.

L1-2: New small left subarticular disc protrusion and mild facet
hypertrophy result in new mild left lateral recess stenosis without
spinal or neural foraminal stenosis.

L2-3: Disc bulging eccentric to the right and mild facet hypertrophy
result in mild spinal stenosis, moderate right and mild left lateral
recess stenosis, and mild right neural foraminal stenosis,
unchanged.

L3-4: Circumferential disc bulging, a new large left subarticular
disc extrusion with caudal migration to the inferior L4 vertebral
body level, congenitally short pedicles, and moderate facet and
ligamentum flavum hypertrophy result in progressive severe spinal
stenosis, severe left greater than right lateral recess stenosis,
and mild right and moderate left neural foraminal stenosis. The disc
extrusion results in left L4 nerve root impingement.

L4-5: Interval left laminectomy and microdiscectomy. Mild epidural
fibrosis about the left L5 nerve root in the lateral recess. Mild
disc bulging and facet hypertrophy result in mild right lateral
recess stenosis and mild left neural foraminal stenosis without
residual spinal stenosis.

L5-S1: Disc bulging, endplate spurring, and disc space height loss
result in mild bilateral neural foraminal stenosis which has mildly
improved as well as mild left lateral recess stenosis.
IMPRESSION: 1. New large left subarticular disc extrusion at L3-4 with severe
multifactorial spinal and left greater than right lateral recess
stenosis.
2. Postsurgical changes at L4-5 without residual spinal stenosis.
3. New mild left lateral recess stenosis at L1-2.
4. Unchanged mild spinal stenosis at L2-3.

## 2020-01-16 MED ORDER — GADOBENATE DIMEGLUMINE 529 MG/ML IV SOLN
19.0000 mL | Freq: Once | INTRAVENOUS | Status: AC | PRN
  Administered 2020-01-16: 19 mL via INTRAVENOUS

## 2020-01-20 DIAGNOSIS — M48061 Spinal stenosis, lumbar region without neurogenic claudication: Secondary | ICD-10-CM | POA: Diagnosis not present

## 2020-01-20 DIAGNOSIS — M48062 Spinal stenosis, lumbar region with neurogenic claudication: Secondary | ICD-10-CM | POA: Diagnosis not present

## 2020-01-20 DIAGNOSIS — M25532 Pain in left wrist: Secondary | ICD-10-CM | POA: Diagnosis not present

## 2020-01-20 DIAGNOSIS — M5416 Radiculopathy, lumbar region: Secondary | ICD-10-CM | POA: Diagnosis not present

## 2020-01-20 DIAGNOSIS — M5126 Other intervertebral disc displacement, lumbar region: Secondary | ICD-10-CM | POA: Diagnosis not present

## 2020-01-20 DIAGNOSIS — Z6830 Body mass index (BMI) 30.0-30.9, adult: Secondary | ICD-10-CM | POA: Diagnosis not present

## 2020-01-20 DIAGNOSIS — M545 Low back pain: Secondary | ICD-10-CM | POA: Diagnosis not present

## 2020-01-21 ENCOUNTER — Other Ambulatory Visit: Payer: Self-pay | Admitting: Neurosurgery

## 2020-01-23 ENCOUNTER — Other Ambulatory Visit (HOSPITAL_COMMUNITY): Payer: Medicare PPO

## 2020-01-23 ENCOUNTER — Encounter (HOSPITAL_COMMUNITY): Payer: Self-pay | Admitting: Neurosurgery

## 2020-01-23 ENCOUNTER — Other Ambulatory Visit: Payer: Self-pay

## 2020-01-23 NOTE — Progress Notes (Signed)
Spoke with pt for pre-op call. Pt denies cardiac history or diabetes. Pt is treated for HTN.   Covid test scheduled for 01/24/20. Pt given address of the Covid Testing site. Pt instructed about quarantining from the time the test is done until he comes to the hospital on Tuesday.

## 2020-01-24 ENCOUNTER — Other Ambulatory Visit (HOSPITAL_COMMUNITY)
Admission: RE | Admit: 2020-01-24 | Discharge: 2020-01-24 | Disposition: A | Discharge status: Home / Self Care | Payer: Medicare PPO | Source: Ambulatory Visit | Attending: Neurosurgery | Admitting: Neurosurgery

## 2020-01-24 DIAGNOSIS — Z20822 Contact with and (suspected) exposure to covid-19: Secondary | ICD-10-CM | POA: Diagnosis not present

## 2020-01-24 DIAGNOSIS — Z01812 Encounter for preprocedural laboratory examination: Secondary | ICD-10-CM | POA: Diagnosis not present

## 2020-01-24 LAB — SARS CORONAVIRUS 2 (TAT 6-24 HRS): SARS Coronavirus 2: NEGATIVE

## 2020-01-25 ENCOUNTER — Other Ambulatory Visit: Payer: Medicare PPO

## 2020-01-26 ENCOUNTER — Ambulatory Visit: Payer: Medicare PPO | Admitting: Dermatology

## 2020-01-27 ENCOUNTER — Encounter (HOSPITAL_COMMUNITY): Payer: Self-pay | Admitting: Neurosurgery

## 2020-01-27 ENCOUNTER — Other Ambulatory Visit: Payer: Self-pay

## 2020-01-27 ENCOUNTER — Ambulatory Visit (HOSPITAL_COMMUNITY): Payer: Medicare PPO

## 2020-01-27 ENCOUNTER — Observation Stay (HOSPITAL_COMMUNITY)
Admission: RE | Admit: 2020-01-27 | Discharge: 2020-01-28 | Disposition: A | Discharge status: Home / Self Care | Payer: Medicare PPO | Attending: Neurosurgery | Admitting: Neurosurgery

## 2020-01-27 ENCOUNTER — Encounter (HOSPITAL_COMMUNITY)
Admission: RE | Disposition: A | Discharge status: Home / Self Care | Payer: Self-pay | Source: Home / Self Care | Attending: Neurosurgery

## 2020-01-27 DIAGNOSIS — M25532 Pain in left wrist: Secondary | ICD-10-CM | POA: Diagnosis not present

## 2020-01-27 DIAGNOSIS — M4316 Spondylolisthesis, lumbar region: Secondary | ICD-10-CM | POA: Diagnosis not present

## 2020-01-27 DIAGNOSIS — Z6830 Body mass index (BMI) 30.0-30.9, adult: Secondary | ICD-10-CM | POA: Diagnosis not present

## 2020-01-27 DIAGNOSIS — M4807 Spinal stenosis, lumbosacral region: Secondary | ICD-10-CM | POA: Diagnosis not present

## 2020-01-27 DIAGNOSIS — M545 Low back pain: Secondary | ICD-10-CM | POA: Diagnosis present

## 2020-01-27 DIAGNOSIS — M5116 Intervertebral disc disorders with radiculopathy, lumbar region: Secondary | ICD-10-CM | POA: Diagnosis not present

## 2020-01-27 DIAGNOSIS — M5126 Other intervertebral disc displacement, lumbar region: Principal | ICD-10-CM | POA: Insufficient documentation

## 2020-01-27 DIAGNOSIS — M48062 Spinal stenosis, lumbar region with neurogenic claudication: Secondary | ICD-10-CM | POA: Insufficient documentation

## 2020-01-27 DIAGNOSIS — M48061 Spinal stenosis, lumbar region without neurogenic claudication: Secondary | ICD-10-CM | POA: Diagnosis not present

## 2020-01-27 DIAGNOSIS — J45909 Unspecified asthma, uncomplicated: Secondary | ICD-10-CM | POA: Diagnosis not present

## 2020-01-27 DIAGNOSIS — M47816 Spondylosis without myelopathy or radiculopathy, lumbar region: Secondary | ICD-10-CM | POA: Diagnosis not present

## 2020-01-27 DIAGNOSIS — M4726 Other spondylosis with radiculopathy, lumbar region: Secondary | ICD-10-CM | POA: Diagnosis not present

## 2020-01-27 DIAGNOSIS — Z419 Encounter for procedure for purposes other than remedying health state, unspecified: Secondary | ICD-10-CM

## 2020-01-27 HISTORY — DX: Personal history of urinary calculi: Z87.442

## 2020-01-27 HISTORY — DX: Unspecified osteoarthritis, unspecified site: M19.90

## 2020-01-27 HISTORY — PX: LUMBAR LAMINECTOMY/DECOMPRESSION MICRODISCECTOMY: SHX5026

## 2020-01-27 LAB — BASIC METABOLIC PANEL
Anion gap: 11 (ref 5–15)
BUN: 15 mg/dL (ref 8–23)
CO2: 30 mmol/L (ref 22–32)
Calcium: 9.7 mg/dL (ref 8.9–10.3)
Chloride: 99 mmol/L (ref 98–111)
Creatinine, Ser: 1.09 mg/dL (ref 0.61–1.24)
GFR calc Af Amer: >60 mL/min (ref >60)
GFR calc non Af Amer: >60 mL/min (ref >60)
Glucose, Bld: 102 mg/dL — ABNORMAL HIGH (ref 70–99)
Potassium: 3.4 mmol/L — ABNORMAL LOW (ref 3.5–5.1)
Sodium: 140 mmol/L (ref 135–145)

## 2020-01-27 LAB — CBC
HCT: 45.8 % (ref 39.0–52.0)
Hemoglobin: 15.2 g/dL (ref 13.0–17.0)
MCH: 30 pg (ref 26.0–34.0)
MCHC: 33.2 g/dL (ref 30.0–36.0)
MCV: 90.3 fL (ref 80.0–100.0)
Platelets: 197 10*3/uL (ref 150–400)
RBC: 5.07 MIL/uL (ref 4.22–5.81)
RDW: 12.8 % (ref 11.5–15.5)
WBC: 5.4 10*3/uL (ref 4.0–10.5)
nRBC: 0 % (ref 0.0–0.2)

## 2020-01-27 IMAGING — CR DG LUMBAR SPINE 2-3V
2 series · 2 of 2 positions shown · non-contrast
Comparison: Lumbar spine MRI [DATE]

CLINICAL DATA: Provided history: Surgery, elective. Localization
for L3-4 micro discectomy.

EXAM:
LUMBAR SPINE - 2-3 VIEW

[xtable lateral (1 of 2)]
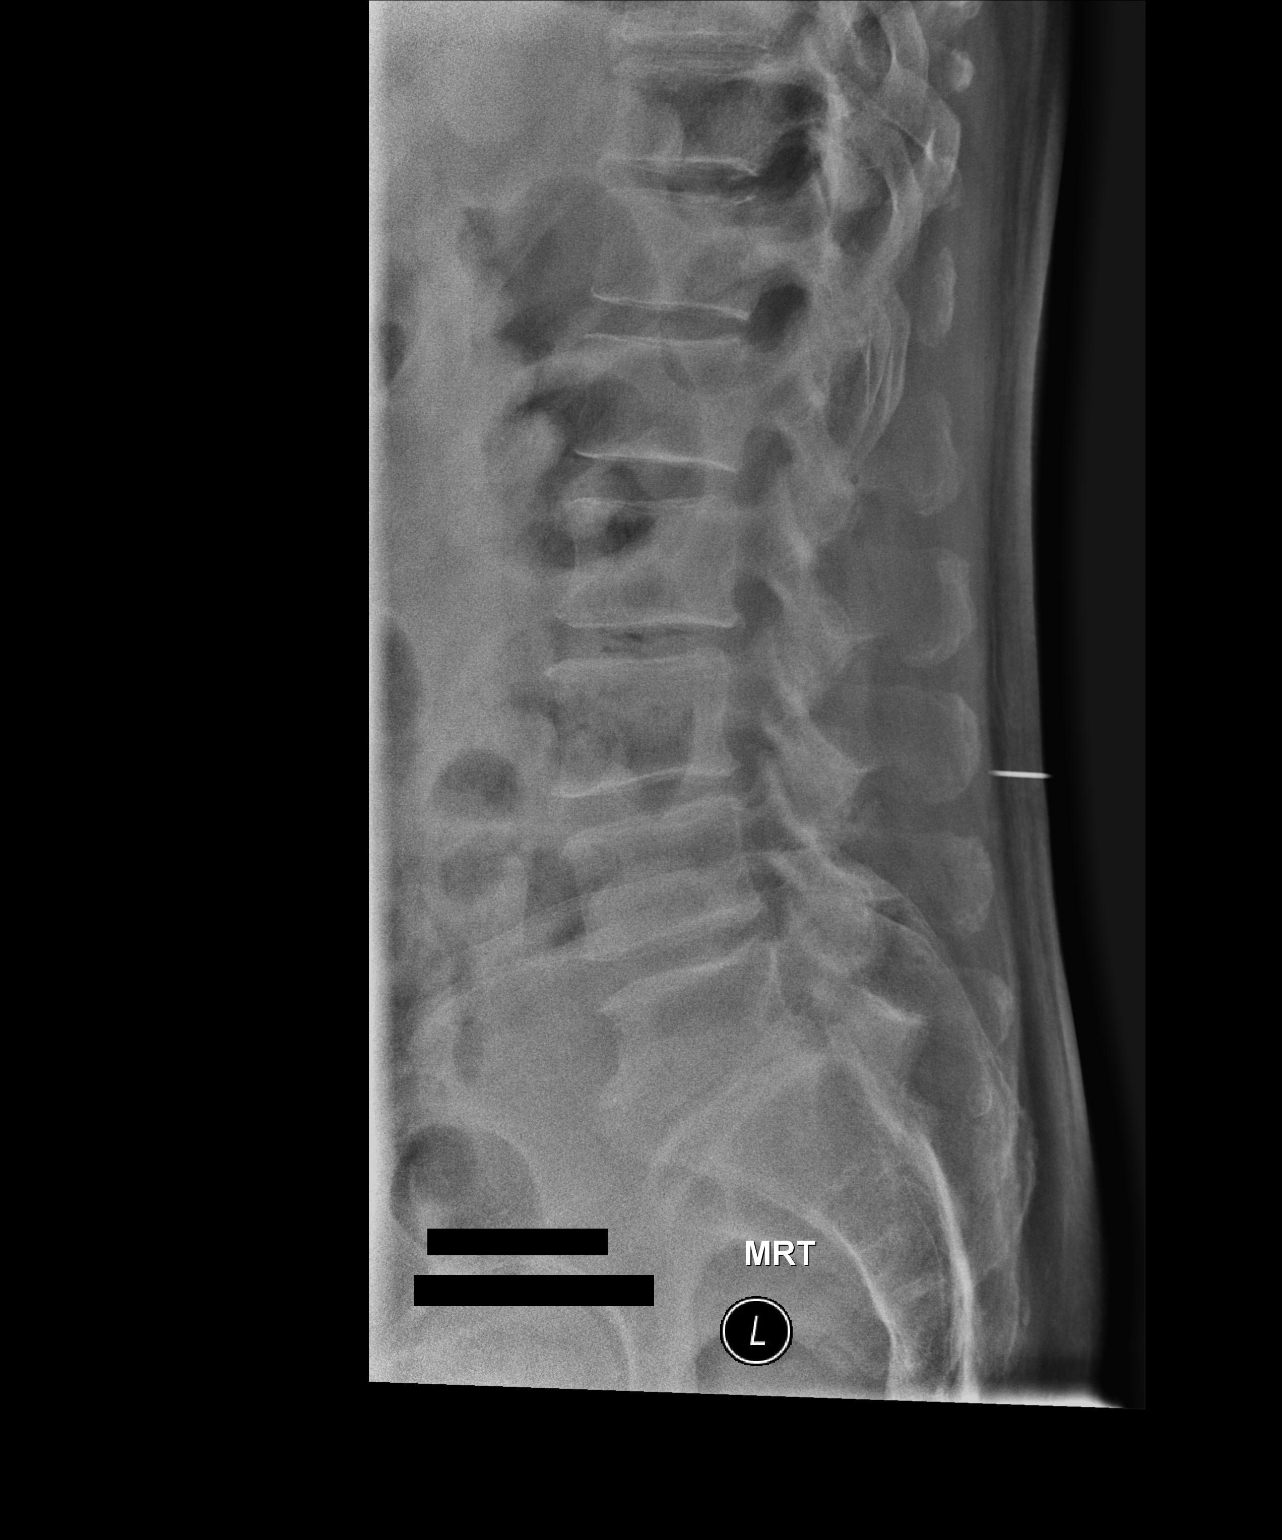

[xtable lateral (2 of 2)]
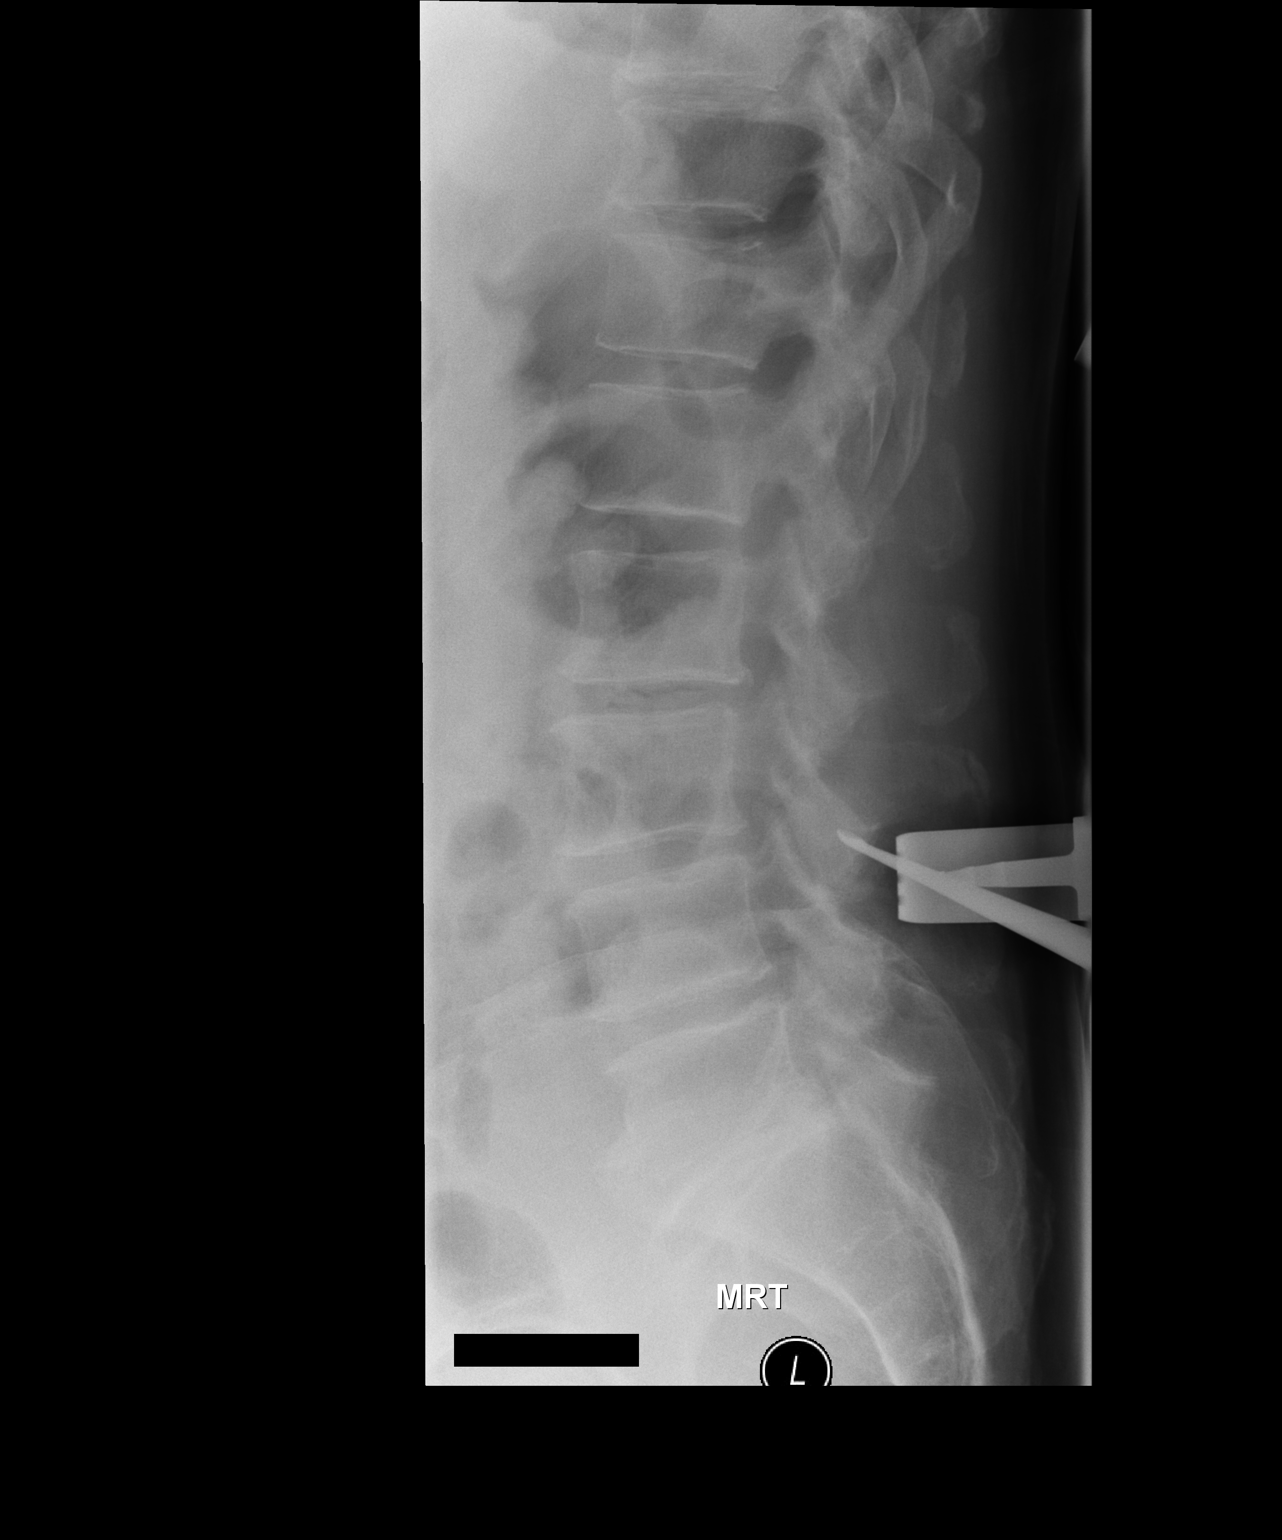

[2 of 2 positions shown; findings below may reference images not displayed]

FINDINGS: Two intraoperative lateral view radiographs of the lumbar spine are
submitted. On the first image, a metallic site marker projects
posterior to the L3 spinous process. On the second image, a metallic
site marker projects posterior to the L3 inferior articular
processes. Multilevel disc space narrowing. Most notably, there is
moderate/advanced disc space narrowing at L5-S1. Mild L2-L3 grade 1
retrolisthesis. Multilevel degenerative endplate spurring. Mild
multilevel facet arthrosis, greatest within the lower lumbar spine.
IMPRESSION: Two intraoperative lateral view radiographs of the lumbar spine as
described.

## 2020-01-27 SURGERY — LUMBAR LAMINECTOMY/DECOMPRESSION MICRODISCECTOMY 1 LEVEL
Anesthesia: General | Site: Spine Lumbar | Laterality: Left

## 2020-01-27 MED ORDER — 0.9 % SODIUM CHLORIDE (POUR BTL) OPTIME
TOPICAL | Status: DC | PRN
  Administered 2020-01-27: 1000 mL

## 2020-01-27 MED ORDER — FENTANYL CITRATE (PF) 100 MCG/2ML IJ SOLN
INTRAMUSCULAR | Status: DC | PRN
  Administered 2020-01-27: 100 ug via INTRAVENOUS

## 2020-01-27 MED ORDER — POLYETHYLENE GLYCOL 3350 17 G PO PACK: 17.0000 g | PACK | Freq: Every day | ORAL | Status: DC | PRN

## 2020-01-27 MED ORDER — LOSARTAN POTASSIUM-HCTZ 100-25 MG PO TABS: 1.0000 | ORAL_TABLET | Freq: Every day | ORAL | Status: DC

## 2020-01-27 MED ORDER — BUPIVACAINE HCL (PF) 0.5 % IJ SOLN
INTRAMUSCULAR | Status: DC | PRN
  Administered 2020-01-27: 5 mL

## 2020-01-27 MED ORDER — CEFAZOLIN SODIUM-DEXTROSE 2-4 GM/100ML-% IV SOLN
2.0000 g | Freq: Three times a day (TID) | INTRAVENOUS | Status: AC
  Administered 2020-01-27 – 2020-01-28 (×2): 2 g via INTRAVENOUS
  Filled 2020-01-27 (×2): qty 100

## 2020-01-27 MED ORDER — OMEGA-3-ACID ETHYL ESTERS 1 G PO CAPS
1.0000 g | ORAL_CAPSULE | Freq: Every day | ORAL | Status: DC
  Filled 2020-01-27: qty 1

## 2020-01-27 MED ORDER — SUGAMMADEX SODIUM 200 MG/2ML IV SOLN
INTRAVENOUS | Status: DC | PRN
  Administered 2020-01-27: 200 mg via INTRAVENOUS

## 2020-01-27 MED ORDER — METHOCARBAMOL 1000 MG/10ML IJ SOLN
500.0000 mg | Freq: Four times a day (QID) | INTRAVENOUS | Status: DC | PRN
  Filled 2020-01-27: qty 5

## 2020-01-27 MED ORDER — PHENYLEPHRINE HCL-NACL 10-0.9 MG/250ML-% IV SOLN
INTRAVENOUS | Status: DC | PRN
  Administered 2020-01-27: 50 ug/min via INTRAVENOUS

## 2020-01-27 MED ORDER — FENTANYL CITRATE (PF) 100 MCG/2ML IJ SOLN
INTRAMUSCULAR | Status: DC | PRN
  Administered 2020-01-27: 100 ug via INTRAVENOUS
  Administered 2020-01-27: 50 ug via INTRAVENOUS

## 2020-01-27 MED ORDER — MENTHOL 3 MG MT LOZG: 1.0000 | LOZENGE | OROMUCOSAL | Status: DC | PRN

## 2020-01-27 MED ORDER — PHENOL 1.4 % MT LIQD: 1.0000 | OROMUCOSAL | Status: DC | PRN

## 2020-01-27 MED ORDER — METHYLPREDNISOLONE ACETATE 80 MG/ML IJ SUSP
INTRAMUSCULAR | Status: DC | PRN
  Administered 2020-01-27: 80 mg

## 2020-01-27 MED ORDER — HYDROMORPHONE HCL 1 MG/ML IJ SOLN: 0.5000 mg | INTRAMUSCULAR | Status: DC | PRN

## 2020-01-27 MED ORDER — ACETAMINOPHEN 500 MG PO TABS
1000.0000 mg | ORAL_TABLET | Freq: Once | ORAL | Status: AC
  Administered 2020-01-27: 1000 mg via ORAL

## 2020-01-27 MED ORDER — LIDOCAINE-EPINEPHRINE 1 %-1:100000 IJ SOLN
INTRAMUSCULAR | Status: AC
  Filled 2020-01-27: qty 1

## 2020-01-27 MED ORDER — PHENYLEPHRINE 40 MCG/ML (10ML) SYRINGE FOR IV PUSH (FOR BLOOD PRESSURE SUPPORT)
PREFILLED_SYRINGE | INTRAVENOUS | Status: DC | PRN
  Administered 2020-01-27: 20 ug via INTRAVENOUS
  Administered 2020-01-27: 80 ug via INTRAVENOUS
  Administered 2020-01-27: 40 ug via INTRAVENOUS

## 2020-01-27 MED ORDER — SODIUM CHLORIDE 0.9% FLUSH
3.0000 mL | Freq: Two times a day (BID) | INTRAVENOUS | Status: DC
  Administered 2020-01-27: 3 mL via INTRAVENOUS

## 2020-01-27 MED ORDER — PROPOFOL 10 MG/ML IV BOLUS
INTRAVENOUS | Status: DC | PRN
  Administered 2020-01-27: 140 mg via INTRAVENOUS

## 2020-01-27 MED ORDER — HYDROCHLOROTHIAZIDE 25 MG PO TABS: 25.0000 mg | ORAL_TABLET | Freq: Every day | ORAL | Status: DC

## 2020-01-27 MED ORDER — AMLODIPINE BESYLATE 5 MG PO TABS: 5.0000 mg | ORAL_TABLET | Freq: Every day | ORAL | Status: DC

## 2020-01-27 MED ORDER — SODIUM CHLORIDE 0.9% FLUSH: 3.0000 mL | INTRAVENOUS | Status: DC | PRN

## 2020-01-27 MED ORDER — OXYCODONE HCL 5 MG PO TABS
5.0000 mg | ORAL_TABLET | ORAL | Status: DC | PRN
  Administered 2020-01-28: 5 mg via ORAL
  Filled 2020-01-27: qty 1

## 2020-01-27 MED ORDER — BUPIVACAINE HCL (PF) 0.5 % IJ SOLN
INTRAMUSCULAR | Status: AC
  Filled 2020-01-27: qty 30

## 2020-01-27 MED ORDER — ALUM & MAG HYDROXIDE-SIMETH 200-200-20 MG/5ML PO SUSP: 30.0000 mL | Freq: Four times a day (QID) | ORAL | Status: DC | PRN

## 2020-01-27 MED ORDER — LIDOCAINE 2% (20 MG/ML) 5 ML SYRINGE
INTRAMUSCULAR | Status: DC | PRN
  Administered 2020-01-27: 8 mg via INTRAVENOUS

## 2020-01-27 MED ORDER — CHLORHEXIDINE GLUCONATE CLOTH 2 % EX PADS: 6.0000 | MEDICATED_PAD | Freq: Once | CUTANEOUS | Status: DC

## 2020-01-27 MED ORDER — CYCLOBENZAPRINE HCL 10 MG PO TABS
10.0000 mg | ORAL_TABLET | Freq: Every day | ORAL | Status: DC
  Administered 2020-01-27: 10 mg via ORAL
  Filled 2020-01-27: qty 1

## 2020-01-27 MED ORDER — PANTOPRAZOLE SODIUM 40 MG PO TBEC
40.0000 mg | DELAYED_RELEASE_TABLET | Freq: Every day | ORAL | Status: DC
  Administered 2020-01-27 (×2): 40 mg via ORAL
  Filled 2020-01-27: qty 1

## 2020-01-27 MED ORDER — ONDANSETRON HCL 4 MG/2ML IJ SOLN: 4.0000 mg | Freq: Four times a day (QID) | INTRAMUSCULAR | Status: DC | PRN

## 2020-01-27 MED ORDER — LIDOCAINE-EPINEPHRINE 1 %-1:100000 IJ SOLN
INTRAMUSCULAR | Status: DC | PRN
  Administered 2020-01-27: 5 mL

## 2020-01-27 MED ORDER — FENTANYL CITRATE (PF) 100 MCG/2ML IJ SOLN
INTRAMUSCULAR | Status: AC
  Filled 2020-01-27: qty 2

## 2020-01-27 MED ORDER — ONDANSETRON HCL 4 MG/2ML IJ SOLN
INTRAMUSCULAR | Status: DC | PRN
  Administered 2020-01-27: 4 mg via INTRAVENOUS

## 2020-01-27 MED ORDER — ACETAMINOPHEN 650 MG RE SUPP: 650.0000 mg | RECTAL | Status: DC | PRN

## 2020-01-27 MED ORDER — BISACODYL 10 MG RE SUPP: 10.0000 mg | Freq: Every day | RECTAL | Status: DC | PRN

## 2020-01-27 MED ORDER — CEFAZOLIN SODIUM-DEXTROSE 2-4 GM/100ML-% IV SOLN
INTRAVENOUS | Status: AC
  Filled 2020-01-27: qty 100

## 2020-01-27 MED ORDER — DOCUSATE SODIUM 100 MG PO CAPS
100.0000 mg | ORAL_CAPSULE | Freq: Two times a day (BID) | ORAL | Status: DC
  Administered 2020-01-27: 100 mg via ORAL
  Filled 2020-01-27: qty 1

## 2020-01-27 MED ORDER — SODIUM CHLORIDE 0.9 % IV SOLN: 250.0000 mL | INTRAVENOUS | Status: DC

## 2020-01-27 MED ORDER — HYDROCODONE-ACETAMINOPHEN 5-325 MG PO TABS: 1.0000 | ORAL_TABLET | ORAL | Status: DC | PRN

## 2020-01-27 MED ORDER — METHOCARBAMOL 500 MG PO TABS: 500.0000 mg | ORAL_TABLET | Freq: Four times a day (QID) | ORAL | Status: DC | PRN

## 2020-01-27 MED ORDER — THROMBIN 5000 UNITS EX SOLR: OROMUCOSAL | Status: DC | PRN

## 2020-01-27 MED ORDER — ONDANSETRON HCL 4 MG PO TABS: 4.0000 mg | ORAL_TABLET | Freq: Four times a day (QID) | ORAL | Status: DC | PRN

## 2020-01-27 MED ORDER — FLEET ENEMA 7-19 GM/118ML RE ENEM: 1.0000 | ENEMA | Freq: Once | RECTAL | Status: DC | PRN

## 2020-01-27 MED ORDER — PROPOFOL 10 MG/ML IV BOLUS
INTRAVENOUS | Status: AC
  Filled 2020-01-27: qty 20

## 2020-01-27 MED ORDER — PANTOPRAZOLE SODIUM 40 MG IV SOLR: 40.0000 mg | Freq: Every day | INTRAVENOUS | Status: DC

## 2020-01-27 MED ORDER — METHYLPREDNISOLONE ACETATE 80 MG/ML IJ SUSP
INTRAMUSCULAR | Status: AC
  Filled 2020-01-27: qty 1

## 2020-01-27 MED ORDER — KCL IN DEXTROSE-NACL 20-5-0.45 MEQ/L-%-% IV SOLN: INTRAVENOUS | Status: DC

## 2020-01-27 MED ORDER — VITAMIN E 180 MG (400 UNIT) PO CAPS
400.0000 [IU] | ORAL_CAPSULE | Freq: Every day | ORAL | Status: DC
  Filled 2020-01-27 (×2): qty 1

## 2020-01-27 MED ORDER — CEFAZOLIN SODIUM-DEXTROSE 2-4 GM/100ML-% IV SOLN
2.0000 g | INTRAVENOUS | Status: AC
  Administered 2020-01-27: 2 g via INTRAVENOUS

## 2020-01-27 MED ORDER — LOSARTAN POTASSIUM 50 MG PO TABS: 100.0000 mg | ORAL_TABLET | Freq: Every day | ORAL | Status: DC

## 2020-01-27 MED ORDER — ACETAMINOPHEN 325 MG PO TABS: 650.0000 mg | ORAL_TABLET | ORAL | Status: DC | PRN

## 2020-01-27 MED ORDER — MIDAZOLAM HCL 2 MG/2ML IJ SOLN
INTRAMUSCULAR | Status: DC | PRN
  Administered 2020-01-27: 1 mg via INTRAVENOUS

## 2020-01-27 MED ORDER — LACTATED RINGERS IV SOLN: INTRAVENOUS | Status: DC

## 2020-01-27 MED ORDER — CHLORHEXIDINE GLUCONATE 0.12 % MT SOLN: 15.0000 mL | Freq: Once | OROMUCOSAL | Status: AC

## 2020-01-27 MED ORDER — FENTANYL CITRATE (PF) 250 MCG/5ML IJ SOLN
INTRAMUSCULAR | Status: AC
  Filled 2020-01-27: qty 5

## 2020-01-27 MED ORDER — CHLORHEXIDINE GLUCONATE 0.12 % MT SOLN
OROMUCOSAL | Status: AC
  Administered 2020-01-27: 15 mL via OROMUCOSAL
  Filled 2020-01-27: qty 15

## 2020-01-27 MED ORDER — GLUCOSAMINE-MSM 500-400 MG PO CAPS: ORAL_CAPSULE | Freq: Every day | ORAL | Status: DC

## 2020-01-27 MED ORDER — ZOLPIDEM TARTRATE 5 MG PO TABS: 5.0000 mg | ORAL_TABLET | Freq: Every evening | ORAL | Status: DC | PRN

## 2020-01-27 MED ORDER — DEXAMETHASONE SODIUM PHOSPHATE 10 MG/ML IJ SOLN
INTRAMUSCULAR | Status: DC | PRN
  Administered 2020-01-27: 10 mg via INTRAVENOUS

## 2020-01-27 MED ORDER — ORAL CARE MOUTH RINSE: 15.0000 mL | Freq: Once | OROMUCOSAL | Status: AC

## 2020-01-27 MED ORDER — MIDAZOLAM HCL 2 MG/2ML IJ SOLN
INTRAMUSCULAR | Status: AC
  Filled 2020-01-27: qty 2

## 2020-01-27 MED ORDER — THROMBIN 5000 UNITS EX SOLR
CUTANEOUS | Status: AC
  Filled 2020-01-27: qty 5000

## 2020-01-27 MED ORDER — ROCURONIUM BROMIDE 10 MG/ML (PF) SYRINGE
PREFILLED_SYRINGE | INTRAVENOUS | Status: DC | PRN
  Administered 2020-01-27: 60 mg via INTRAVENOUS

## 2020-01-27 SURGICAL SUPPLY — 53 items
BAND INSRT 18 STRL LF DISP RB (MISCELLANEOUS) ×2
BAND RUBBER #18 3X1/16 STRL (MISCELLANEOUS) ×6 IMPLANT
BLADE CLIPPER SURG (BLADE) IMPLANT
BUR MATCHSTICK NEURO 3.0 LAGG (BURR) ×3 IMPLANT
BUR ROUND FLUTED 5 RND (BURR) ×2 IMPLANT
BUR ROUND FLUTED 5MM RND (BURR) ×1
CANISTER SUCT 3000ML PPV (MISCELLANEOUS) ×3 IMPLANT
CARTRIDGE OIL MAESTRO DRILL (MISCELLANEOUS) ×1 IMPLANT
COVER WAND RF STERILE (DRAPES) ×3 IMPLANT
DECANTER SPIKE VIAL GLASS SM (MISCELLANEOUS) ×3 IMPLANT
DERMABOND ADVANCED (GAUZE/BANDAGES/DRESSINGS) ×2
DERMABOND ADVANCED .7 DNX12 (GAUZE/BANDAGES/DRESSINGS) ×1 IMPLANT
DIFFUSER DRILL AIR PNEUMATIC (MISCELLANEOUS) ×3 IMPLANT
DRAPE LAPAROTOMY 100X72X124 (DRAPES) ×3 IMPLANT
DRAPE MICROSCOPE LEICA (MISCELLANEOUS) ×3 IMPLANT
DRAPE SURG 17X23 STRL (DRAPES) ×3 IMPLANT
DRSG OPSITE POSTOP 3X4 (GAUZE/BANDAGES/DRESSINGS) ×3 IMPLANT
DURAPREP 26ML APPLICATOR (WOUND CARE) ×3 IMPLANT
ELECT REM PT RETURN 9FT ADLT (ELECTROSURGICAL) ×3
ELECTRODE REM PT RTRN 9FT ADLT (ELECTROSURGICAL) ×1 IMPLANT
GAUZE 4X4 16PLY RFD (DISPOSABLE) IMPLANT
GAUZE SPONGE 4X4 12PLY STRL (GAUZE/BANDAGES/DRESSINGS) IMPLANT
GLOVE BIO SURGEON STRL SZ8 (GLOVE) ×3 IMPLANT
GLOVE BIOGEL PI IND STRL 8 (GLOVE) ×1 IMPLANT
GLOVE BIOGEL PI IND STRL 8.5 (GLOVE) ×1 IMPLANT
GLOVE BIOGEL PI INDICATOR 8 (GLOVE) ×2
GLOVE BIOGEL PI INDICATOR 8.5 (GLOVE) ×2
GLOVE ECLIPSE 8.0 STRL XLNG CF (GLOVE) ×3 IMPLANT
GLOVE EXAM NITRILE XL STR (GLOVE) IMPLANT
GOWN STRL REUS W/ TWL LRG LVL3 (GOWN DISPOSABLE) IMPLANT
GOWN STRL REUS W/ TWL XL LVL3 (GOWN DISPOSABLE) ×1 IMPLANT
GOWN STRL REUS W/TWL 2XL LVL3 (GOWN DISPOSABLE) ×3 IMPLANT
GOWN STRL REUS W/TWL LRG LVL3 (GOWN DISPOSABLE)
GOWN STRL REUS W/TWL XL LVL3 (GOWN DISPOSABLE) ×3
HEMOSTAT POWDER KIT SURGIFOAM (HEMOSTASIS) ×3 IMPLANT
KIT BASIN OR (CUSTOM PROCEDURE TRAY) ×3 IMPLANT
KIT TURNOVER KIT B (KITS) ×3 IMPLANT
NEEDLE HYPO 18GX1.5 BLUNT FILL (NEEDLE) IMPLANT
NEEDLE HYPO 25X1 1.5 SAFETY (NEEDLE) ×3 IMPLANT
NEEDLE SPNL 18GX3.5 QUINCKE PK (NEEDLE) ×3 IMPLANT
NS IRRIG 1000ML POUR BTL (IV SOLUTION) ×3 IMPLANT
OIL CARTRIDGE MAESTRO DRILL (MISCELLANEOUS) ×3
PACK LAMINECTOMY NEURO (CUSTOM PROCEDURE TRAY) ×3 IMPLANT
PAD ARMBOARD 7.5X6 YLW CONV (MISCELLANEOUS) ×9 IMPLANT
SPONGE SURGIFOAM ABS GEL SZ50 (HEMOSTASIS) IMPLANT
SUT VIC AB 0 CT1 18XCR BRD8 (SUTURE) ×1 IMPLANT
SUT VIC AB 0 CT1 8-18 (SUTURE) ×3
SUT VIC AB 2-0 CT1 18 (SUTURE) ×3 IMPLANT
SUT VIC AB 3-0 SH 8-18 (SUTURE) ×3 IMPLANT
SYR 5ML LL (SYRINGE) IMPLANT
TOWEL GREEN STERILE (TOWEL DISPOSABLE) ×3 IMPLANT
TOWEL GREEN STERILE FF (TOWEL DISPOSABLE) ×3 IMPLANT
WATER STERILE IRR 1000ML POUR (IV SOLUTION) ×3 IMPLANT

## 2020-01-27 NOTE — Anesthesia Postprocedure Evaluation (Signed)
Anesthesia Post Note  Patient: Adam Sherman  Procedure(s) Performed: Left Lumbar Three-Four  Microdiscectomy (Left Spine Lumbar)     Patient location during evaluation: PACU Anesthesia Type: General Level of consciousness: awake and alert, awake and oriented Pain management: pain level controlled Vital Signs Assessment: post-procedure vital signs reviewed and stable Respiratory status: spontaneous breathing, nonlabored ventilation, respiratory function stable and patient connected to nasal cannula oxygen Cardiovascular status: blood pressure returned to baseline and stable Postop Assessment: no apparent nausea or vomiting Anesthetic complications: no   No complications documented.  Last Vitals:  Vitals:   01/27/20 1736 01/27/20 1751  BP: 98/76 111/85  Pulse: 89 72  Resp: 16 13  Temp:    SpO2: 96% 98%    Last Pain:  Vitals:   01/27/20 1751  TempSrc:   PainSc: 0-No pain                 Catalina Gravel

## 2020-01-27 NOTE — Brief Op Note (Signed)
01/27/2020  5:07 PM  PATIENT:  Adam Sherman  74 y.o. 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 It was elected to take him to surgery for left L 34  microdiscectomy.  Procedure: Patient was brought to the operating room and following the smooth and uncomplicated induction of general endotracheal anesthesia he was placed in a prone position on the Wilson frame. Low back was prepped and draped in the usual sterile fashion with betadine scrub and DuraPrep. Localizing radiograph was obtained with a spinal needle.  Area of planned incision was infiltrated with local lidocaine. Incision was made in the midline and carried to the lumbodorsal fascia which was incised on the left side of midline. Subperiosteal dissection was performed exposing what was felt to be L 34 level. Intraoperative x-ray demonstrated marker probes at L3-4. A hemi-semi-laminectomy of L 3 was performed a high-speed drill and completed with Kerrison rongeurs and a generous foraminotomy was performed overlying the superior aspect of the L 4 lamina. Ligamentum flavum was detached and removed in a piecemeal fashion and  the L4 nerve root was decompressed laterally with removal of the superior aspect of the facet and ligamentum causing nerve root compression. The microscope was utilized throughout the surgery and the L 4 nerve root was mobilized medially. This exposed a large amount of soft disc material and a large free fragment of herniated disc material. At this point it was felt that all neural elements were well decompressed and there was no evidence of residual loose disc material within the interspace. The interspace was then irrigated with saline and no additional disc material was mobilized. Hemostasis was assured with bipolar electrocautery and Surgifoam. The interspace was irrigated with Depo-Medrol and fentanyl. The lumbodorsal fascia was closed with 0 Vicryl sutures the subcutaneous tissues reapproximated 2-0 Vicryl inverted sutures and the skin edges were reapproximated with 3-0 Vicryl subcuticular stitch. The wound is dressed with Dermabond and an occlusive dressing. Patient was extubated in the operating room and taken to recovery in stable and satisfactory condition having tolerated his operation well counts were correct at the end of the case.  PLAN OF CARE: Admit for overnight observation  PATIENT DISPOSITION:  PACU - hemodynamically stable.   Delay start of Pharmacological VTE agent (>24hrs) due to surgical blood loss or risk of bleeding: yes

## 2020-01-27 NOTE — Op Note (Signed)
01/27/2020  5:07 PM  PATIENT:  Adam Sherman  74 y.o. 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 It was elected to take him to surgery for left L 34  microdiscectomy.  Procedure: Patient was brought to the operating room and following the smooth and uncomplicated induction of general endotracheal anesthesia he was placed in a prone position on the Wilson frame. Low back was prepped and draped in the usual sterile fashion with betadine scrub and DuraPrep. Localizing radiograph was obtained with a spinal needle.  Area of planned incision was infiltrated with local lidocaine. Incision was made in the midline and carried to the lumbodorsal fascia which was incised on the left side of midline. Subperiosteal dissection was performed exposing what was felt to be L 34 level. Intraoperative x-ray demonstrated marker probes at L3-4. A hemi-semi-laminectomy of L 3 was performed a high-speed drill and completed with Kerrison rongeurs and a generous foraminotomy was performed overlying the superior aspect of the L 4 lamina. Ligamentum flavum was detached and removed in a piecemeal fashion and  the L4 nerve root was decompressed laterally with removal of the superior aspect of the facet and ligamentum causing nerve root compression. The microscope was utilized throughout the surgery and the L 4 nerve root was mobilized medially. This exposed a large amount of soft disc material and a large free fragment of herniated disc material. At this point it was felt that all neural elements were well decompressed and there was no evidence of residual loose disc material within the interspace. The interspace was then irrigated with saline and no additional disc material was mobilized. Hemostasis was assured with bipolar electrocautery and Surgifoam. The interspace was irrigated with Depo-Medrol and fentanyl. The lumbodorsal fascia was closed with 0 Vicryl sutures the subcutaneous tissues reapproximated 2-0 Vicryl inverted sutures and the skin edges were reapproximated with 3-0 Vicryl subcuticular stitch. The wound is dressed with Dermabond and an occlusive dressing. Patient was extubated in the operating room and taken to recovery in stable and satisfactory condition having tolerated his operation well counts were correct at the end of the case.  PLAN OF CARE: Admit for overnight observation  PATIENT DISPOSITION:  PACU - hemodynamically stable.   Delay start of Pharmacological VTE agent (>24hrs) due to surgical blood loss or risk of bleeding: yes

## 2020-01-27 NOTE — Anesthesia Preprocedure Evaluation (Addendum)
Anesthesia Evaluation  Patient identified by MRN, date of birth, ID band Patient awake    Reviewed: Allergy & Precautions, NPO status , Patient's Chart, lab work & pertinent test results  Airway Mallampati: III  TM Distance: >3 FB Neck ROM: Full    Dental  (+) Teeth Intact, Dental Advisory Given   Pulmonary asthma , former smoker,    Pulmonary exam normal breath sounds clear to auscultation       Cardiovascular hypertension, Pt. 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  Reproductive/Obstetrics                            Anesthesia Physical Anesthesia Plan  ASA: II  Anesthesia Plan: General   Post-op Pain Management:    Induction: Intravenous  PONV Risk Score and Plan: 3 and Midazolam, Dexamethasone and Ondansetron  Airway Management Planned: Oral ETT  Additional Equipment:   Intra-op Plan:   Post-operative Plan: Extubation in OR  Informed Consent: I have reviewed the patients History and Physical, chart, labs and discussed the procedure including the risks, benefits and alternatives for the proposed anesthesia with the patient or authorized representative who has indicated his/her understanding and acceptance.     Dental advisory given  Plan Discussed with: CRNA  Anesthesia Plan Comments:         Anesthesia Quick Evaluation

## 2020-01-27 NOTE — Interval H&P Note (Signed)
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  The various methods of treatment have been discussed with the patient and family. After consideration of risks, benefits and other options for treatment, the patient has consented to  Procedure(s): Left Lumbar 3-4 Microdiscectomy (Left) as a surgical intervention.  The patient's history has been reviewed, patient examined, no change in status, stable for surgery.  I have reviewed the patient's chart and labs.  Questions were answered to the patient's satisfaction.     Peggyann Shoals

## 2020-01-27 NOTE — H&P (Signed)
Patient ID:   [id number] Patient: Adam Sherman  Date of Birth: May 30, 1946 Visit Type: Office Visit   Date: 01/20/2020 02:45 PM Provider: Marchia Meiers. Vertell Limber MD   This 74 year old male presents for MRI Review.  HISTORY OF PRESENT ILLNESS: 1.  MRI Review  Mr. Simonis presents today for a follow-up appointment and review of new MRI. At his last visit, his MRI demonstrated a full-thickness tear of the triangular fibrocartilage complex and the left wrist.  He is being followed by Dr. Astrid Divine in [place] for hand issues related to Dupuytren's contracture and his left leg wrist injury.  He stated that since his last visit he has received 4 injections at various places into his left hand and undergone a left carpal tunnel release.  At today's visit he has new complaints of low back pain that radiates into his bilateral lower extremities, left greater than right.  The pain is said to travel down his buttock into his frontal thigh and stops at the knee.  He currently reports his pain to be a 2 to 3/10.  However his pain increases to a 6 to 7/10 with activity and walking.  The pain is a 9/10 at its worst.  He does report some intermittent anterior thigh paresthesia.  He further reports weakness in his left leg, especially in his left foot where he stated "it just wants to flop down. "   His last epidural steroid injection was October 10, 2019 and was targeted at L5 on the left.  He reports no change in his symptoms.  Past surgical history:  Left L4-L5 microdiskectomy      Medical/Surgical/Interim History Reviewed, no change.     Family History: Reviewed, no changes.    Social History: Reviewed, no changes.   MEDICATIONS: (added, continued or stopped this visit) Started Medication Directions Instruction Stopped  amlodipine besylate/benazepril take 1 capsule by oral route  every day   01/20/2020 cyclobenzaprine 10 mg tablet take 1 tablet by oral route 3 times every  day    losartan potassium take 1 tablet by oral route  every day   05/21/2019 Neurontin 300 mg capsule take 1 capsule by oral route 3 times every day    omeprazole take 2 capsule by oral route  every day before a meal   06/20/2019 Percocet 10 mg-325 mg tablet take 1 tablet by oral route  every 6 hours as needed   01/20/2020 Percocet 5 mg-325 mg tablet take 1 tablet by oral route  every 6 hours as needed for pain   06/20/2019 Percocet 5 mg-325 mg tablet take 1 tablet by oral route  every 6 hours as needed for pain  01/20/2020    ALLERGIES: Ingredient Reaction Medication Name Comment NO KNOWN ALLERGIES    No known allergies. Reviewed, no changes.    PHYSICAL EXAM:  Vitals Date Temp F BP Pulse Ht In Wt Lb BMI BSA Pain Score 01/20/2020  121/82 80 68 197.6 30.04  7/10   PHYSICAL EXAM Details General Level of Distress: no acute distress Overall Appearance: normal    Cardiovascular Cardiac: regular rate and rhythm without murmur  Respiratory Lungs: clear to auscultation  Neurological Recent and Remote Memory: normal Attention Span and Concentration:   normal Language: normal Fund of Knowledge: normal  Right Left Sensation: normal normal Upper Extremity Coordination: normal normal  Lower Extremity Coordination: normal normal  Musculoskeletal Gait and Station: normal  Right Left Upper Extremity Muscle Strength: normal normal Lower Extremity Muscle Strength: normal normal  Upper Extremity Muscle Tone:  normal normal Lower Extremity Muscle Tone: normal normal   Motor Strength Upper and lower extremity motor strength was tested in the clinically pertinent muscles. Any abnormal findings will be noted below.   Right Left Tib Anterior:  4/5 EHL:  4/5   Deep Tendon  Reflexes  Right Left Biceps: normal normal Triceps: normal normal Brachioradialis: normal normal Patellar: normal normal Achilles: normal normal  Sensory Sensation was tested at L1 to S1. Any abnormal findings will be noted below.  Right Left L3:  decreased   Cranial Nerves II. Optic Nerve/Visual Fields: normal III. Oculomotor: normal IV. Trochlear: normal V. Trigeminal: normal VI. Abducens: normal VII. Facial: normal VIII. Acoustic/Vestibular: normal IX. Glossopharyngeal: normal X. Vagus: normal XI. Spinal Accessory: normal XII. Hypoglossal: normal  Motor and other Tests Lhermittes: negative Rhomberg: negative    Right Left Hoffman's: normal normal Clonus: normal normal Babinski: normal normal SLR: negative negative Patrick's Corky Sox): negative negative Toe Walk: normal normal Toe Lift: normal normal Heel Walk: normal normal SI Joint: nontender nontender   Additional Findings:  Dysesthetic pain in the left L3 distribution.  Left hip abduction 4-/5.   DIAGNOSTIC RESULTS:  Noncontrast lumbar MRI is remarkable for a large left subarticular disc extrusion at L3-4 with severe spinal stenosis and left greater than right lateral recess stenosis.  Previous surgical site at L4-L5 without any residual spinal stenosis.  Mild spinal stenosis at L2-3.    IMPRESSION:  The patient has a large disc herniation at L3-4 causing significant spinal stenosis with compression of the left exiting nerve root.  The patient has complaints of severe low back pain and left foot weakness.  On nasal exam he has significant left leg weakness with left hip abductor 4-/5 and left EHL 4/5.  Due to the patient's imaging and weakness on exam, I have counseled the patient to pursue surgical intervention.  This will consist of a left L3-4 microdiskectomy.  PLAN: The patient wishes to proceed with surgery.  This will consist of a left L3-4 microdiskectomy at [facility] on August 17th, 2021. Risk  and benefits were discussed in detail with the patient and he wishes to proceed with surgery.  Detail patient education was performed today.  Percocet and Flexeril prescription given to patient while in the office today.  He will follow-up in the clinic 3 weeks S/P left L3-4 microdiskectomy.  Orders: Diagnostic Procedures: Assessment Procedure M48.062 Lumbar Spine- AP/Lat/Flex/Ex Instruction(s)/Education: Assessment Instruction Z68.30 Dietary management education, guidance, and counseling  Completed Orders (this encounter) Order Details Reason Side Interpretation Result Initial Treatment Date Region Lumbar Spine- AP/Lat/Flex/Ex      01/20/2020 All Levels to All Levels Dietary management education, guidance, and counseling Encouraged patient to eat well balanced diet.        Assessment/Plan  # Detail Type Description  1. Assessment Herniation of lumbar intervertebral disc without myelopathy (M51.26).     2. Assessment Radiculopathy, lumbar region (M54.16).     3. Assessment Pain in left wrist (M25.532).     4. Assessment Low back pain, unspecified back pain laterality, with sciatica presence unspecified (M54.5).     5. Assessment Neurogenic claudication (M48.062).     6. Assessment Body mass index (BMI) 30.0-30.9, adult (Z68.30).  Plan Orders Today's instructions / counseling include(s) Dietary management education, guidance, and counseling. Clinical information/comments: Encouraged patient to eat well balanced diet.       Pain Management Plan Pain Scale: 7/10. Method: Numeric Pain Intensity Scale. Location: back. Onset: 05/21/2019. Duration:  varies. Quality: discomforting. Pain management follow-up plan of care: Patient will continue medication management.Marland Kitchen     MEDICATIONS PRESCRIBED TODAY    Rx Quantity Refills PERCOCET 5 mg-325 mg  60 0 CYCLOBENZAPRINE HCL 10 mg  90 0           Provider:  Marchia Meiers. Vertell Limber MD  01/21/2020 08:32 AM    Dictation edited by: Marchia Meiers. Vertell Limber    CC Providers: Deland Pretty [facility] [address] [place],  [place]  [address]-   Anna Voytek  [facility] [address] [place], [place] [address]               Electronically signed by Marchia Meiers. Vertell Limber MD on 01/21/2020 08:32 AM

## 2020-01-27 NOTE — Anesthesia Procedure Notes (Addendum)
Procedure Name: Intubation Date/Time: 01/27/2020 4:06 PM Performed by: Babs Bertin, CRNA Pre-anesthesia Checklist: Patient identified, Emergency Drugs available, Suction available, Patient being monitored and Timeout performed Patient Re-evaluated:Patient Re-evaluated prior to induction Oxygen Delivery Method: Circle System Utilized Preoxygenation: Pre-oxygenation with 100% oxygen Induction Type: IV induction Ventilation: Mask ventilation without difficulty Laryngoscope Size: Miller and 2 (intubation by SRNA) Grade View: Grade I Tube type: Oral Tube size: 7.5 mm Number of attempts: 1 Airway Equipment and Method: Stylet and Oral airway Placement Confirmation: ETT inserted through vocal cords under direct vision,  positive ETCO2 and breath sounds checked- equal and bilateral Secured at: 23 cm Tube secured with: Tape Dental Injury: Teeth and Oropharynx as per pre-operative assessment  Comments: Placed by Hilda Blades

## 2020-01-27 NOTE — Transfer of Care (Signed)
Immediate Anesthesia Transfer of Care Note  Patient: Adam Sherman  Procedure(s) Performed: Left Lumbar Three-Four  Microdiscectomy (Left Spine Lumbar)  Patient Location: PACU  Anesthesia Type:General  Level of Consciousness: awake, alert  and oriented  Airway & Oxygen Therapy: Patient Spontanous Breathing and Patient connected to nasal cannula oxygen  Post-op Assessment: Report given to RN, Post -op Vital signs reviewed and stable and Patient moving all extremities X 4  Post vital signs: Reviewed and stable  Last Vitals:  Vitals Value Taken Time  BP 108/80 01/27/20 1721  Temp    Pulse 74 01/27/20 1722  Resp 16 01/27/20 1722  SpO2 100 % 01/27/20 1722  Vitals shown include unvalidated device data.  Last Pain:  Vitals:   01/27/20 1358  TempSrc:   PainSc: 5       Patients Stated Pain Goal: 3 (53/64/68 0321)  Complications: No complications documented.

## 2020-01-28 ENCOUNTER — Encounter (HOSPITAL_COMMUNITY): Payer: Self-pay | Admitting: Neurosurgery

## 2020-01-28 DIAGNOSIS — M25532 Pain in left wrist: Secondary | ICD-10-CM | POA: Diagnosis not present

## 2020-01-28 DIAGNOSIS — M5126 Other intervertebral disc displacement, lumbar region: Secondary | ICD-10-CM | POA: Diagnosis not present

## 2020-01-28 DIAGNOSIS — Z6830 Body mass index (BMI) 30.0-30.9, adult: Secondary | ICD-10-CM | POA: Diagnosis not present

## 2020-01-28 DIAGNOSIS — M48062 Spinal stenosis, lumbar region with neurogenic claudication: Secondary | ICD-10-CM | POA: Diagnosis not present

## 2020-01-28 NOTE — Plan of Care (Signed)
Pt doing well. Pt given D/C instructions with verbal understanding. Rx's were sent to the pharmacy by MD. Pt's incision is clean and dry with no sign of infection. Pt's IV was removed prior to D/C. Pt D/C'd home via wheelchair per MD order. Pt is stable @ D/C and has no other needs at this time. Keeghan Bialy, RN  

## 2020-01-28 NOTE — Evaluation (Addendum)
Occupational Therapy Evaluation Patient Details Name: Adam Sherman MRN: [id number] DOB: 20-Jul-1945 Today's Date: 01/28/2020    History of Present Illness 74 yo male L3-4 microdiscectomy PMH TFCC tear L wrist, dupuytrens contracture, L 4-5 microdisckectomy L carpal tunnel release   Clinical Impression   Patient evaluated by Occupational Therapy with no further acute OT needs identified. All education has been completed and the patient has no further questions. See below for any follow-up Occupational Therapy or equipment needs. OT to sign off. Thank you for referral.      Follow Up Recommendations  No OT follow up    Equipment Recommendations  None recommended by OT    Recommendations for Other Services       Precautions / Restrictions Precautions Precautions: Back      Mobility Bed Mobility Overal bed mobility: Modified Independent                Transfers Overall transfer level: Modified independent                    Balance                                           ADL either performed or assessed with clinical judgement   ADL Overall ADL's : Needs assistance/impaired Eating/Feeding: Independent       Upper Body Bathing: Modified independent   Lower Body Bathing: Modified independent   Upper Body Dressing : Modified independent   Lower Body Dressing: Modified independent Lower Body Dressing Details (indicate cue type and reason): able to figure 4 cross Toilet Transfer: Modified Independent       Tub/ Shower Transfer: English as a second language teacher Details (indicate cue type and reason): requries use of wall to safely cross  Functional mobility during ADLs: Modified independent General ADL Comments: wife present for all education. wife concerned with patient wanting to play in golf tournament in 2 weeks. pt advised for sitting precautions and making sure to have frequent change of position from chart. pt  concerned daughter wont play if he doesnt play with her.    Back handout provided and reviewed adls in detail. Pt educated on: set an alarm at night for medication, avoid sitting for long periods of time, correct bed positioning for sleeping, correct sequence for bed mobility, avoiding lifting more than 5 pounds and never wash directly over incision. All education is complete and patient indicates understanding.    Vision Baseline Vision/History: No visual deficits       Perception     Praxis      Pertinent Vitals/Pain Pain Assessment: No/denies pain     Hand Dominance Right   Extremity/Trunk Assessment Upper Extremity Assessment Upper Extremity Assessment: Overall WFL for tasks assessed   Lower Extremity Assessment Lower Extremity Assessment: Overall WFL for tasks assessed   Cervical / Trunk Assessment Cervical / Trunk Assessment: Other exceptions Cervical / Trunk Exceptions: s/p back surg   Communication Communication Communication: No difficulties   Cognition Arousal/Alertness: Awake/alert Behavior During Therapy: WFL for tasks assessed/performed Overall Cognitive Status: Within Functional Limits for tasks assessed                                     General Comments  dressed on arrival.     Exercises  Shoulder Instructions      Home Living Family/patient expects to be discharged to:: Private residence Living Arrangements: Spouse/significant other Available Help at Discharge: Family Type of Home: House Home Access: Stairs to enter Technical brewer of Steps: 2   Home Layout: One level     Bathroom Shower/Tub: Teacher, early years/pre: Standard     Home Equipment: None   Additional Comments: concerns about being in golf tournment in 2 weeks. advised changing positions and keeping the round short due to sitting/ standing restrictions       Prior Functioning/Environment Level of Independence: Independent                  OT Problem List:        OT Treatment/Interventions:      OT Goals(Current goals can be found in the care plan section) Acute Rehab OT Goals Patient Stated Goal: to play golf again Potential to Achieve Goals: Good  OT Frequency:     Barriers to D/C:            Co-evaluation              AM-PAC OT "6 Clicks" Daily Activity     Outcome Measure Help from another person eating meals?: None Help from another person taking care of personal grooming?: None Help from another person toileting, which includes using toliet, bedpan, or urinal?: None Help from another person bathing (including washing, rinsing, drying)?: None Help from another person to put on and taking off regular upper body clothing?: None Help from another person to put on and taking off regular lower body clothing?: None 6 Click Score: 24   End of Session Nurse Communication: Mobility status  Activity Tolerance: Patient tolerated treatment well Patient left: Other (comment) (sitting eob for education with wife in chair)  OT Visit Diagnosis: Unsteadiness on feet (R26.81)                Time: 1610-9604 OT Time Calculation (min): 19 min Charges:  OT General Charges $OT Visit: 1 Visit OT Evaluation $OT Eval Moderate Complexity: 1 Mod   Adam Sherman, OTR/L  Acute Rehabilitation Services Pager: [phone] Office: [phone] .   Jeri Modena 01/28/2020, 10:32 AM

## 2020-01-28 NOTE — Discharge Instructions (Signed)
Wound Care Leave incision open to air. You may shower. Do not scrub directly on incision.  Do not put any creams, lotions, or ointments on incision. Activity Walk each and every day, increasing distance each day. No lifting greater than 5 lbs.  Avoid bending, arching, and twisting. No driving for 2 weeks; may ride as a passenger locally. If provided with back brace, wear when out of bed.  It is not necessary to wear in bed. Diet Resume your normal diet.  Return to Work Will be discussed at you follow up appointment. Call Your Doctor If Any of These Occur Redness, drainage, or swelling at the wound.  Temperature greater than 101 degrees. Severe pain not relieved by pain medication. Incision starts to come apart. Follow Up Appt Call today for appointment in 2-3 weeks ([phone]) or for problems.  If you have any hardware placed in your spine, you will need an x-ray before your appointment. 

## 2020-01-28 NOTE — Evaluation (Signed)
Physical Therapy Evaluation and Discharge Patient Details Name: Adam Sherman Adam Sherman Adam Sherman Today's Date: 01/28/2020   History of Present Illness  74 yo male L3-4 microdiscectomy PMH TFCC tear L wrist, dupuytrens contracture, L 4-5 microdisckectomy L carpal tunnel release    Clinical Impression  Patient evaluated by Physical Therapy with no further acute PT needs identified. All education has been completed and the patient has no further questions. Pt was able to demonstrate transfers and ambulation with gross modified independence and no AD. Pt was educated on precautions, appropriate activity progression, and car transfer. See below for any follow-up Physical Therapy or equipment needs. PT is signing off. Thank you for this referral.     Follow Up Recommendations No PT follow up;Supervision - Intermittent    Equipment Recommendations  None recommended by PT    Recommendations for Other Services       Precautions / Restrictions Precautions Precautions: Back Restrictions Weight Bearing Restrictions: No      Mobility  Bed Mobility               General bed mobility comments: Sitting EOB when PT arrived.   Transfers Overall transfer level: Modified independent Equipment used: None             General transfer comment: No assist required as pt powered up to full stand.   Ambulation/Gait Ambulation/Gait assistance: Modified independent (Device/Increase time) Gait Distance (Feet): 400 Feet Assistive device: None Gait Pattern/deviations: WFL(Within Functional Limits) Gait velocity: Decreased Gait velocity interpretation: >2.62 ft/sec, indicative of community ambulatory General Gait Details: Pt ambulating well without unsteadiness or overt LOB noted.   Stairs            Wheelchair Mobility    Modified Rankin (Stroke Patients Only)       Balance Overall balance assessment: No apparent balance deficits (not formally assessed)                                            Pertinent Vitals/Pain Pain Assessment: No/denies pain    Home Living Family/patient expects to be discharged to:: Private residence Living Arrangements: Spouse/significant other Available Help at Discharge: Family Type of Home: House Home Access: Stairs to enter   Technical brewer of Steps: 2 Home Layout: One level Home Equipment: None Additional Comments: concerns about being in golf tournment in 2 weeks. advised changing positions and keeping the round short due to sitting/ standing restrictions     Prior Function Level of Independence: Independent               Hand Dominance   Dominant Hand: Right    Extremity/Trunk Assessment   Upper Extremity Assessment Upper Extremity Assessment: Defer to OT evaluation    Lower Extremity Assessment Lower Extremity Assessment: Overall WFL for tasks assessed    Cervical / Trunk Assessment Cervical / Trunk Assessment: Other exceptions Cervical / Trunk Exceptions: s/p back surg  Communication   Communication: No difficulties  Cognition Arousal/Alertness: Awake/alert Behavior During Therapy: WFL for tasks assessed/performed Overall Cognitive Status: Within Functional Limits for tasks assessed                                        General Comments      Exercises     Assessment/Plan  PT Assessment Patent does not need any further PT services  PT Problem List         PT Treatment Interventions      PT Goals (Current goals can be found in the Care Plan section)  Acute Rehab PT Goals Patient Stated Goal: to play golf again PT Goal Formulation: All assessment and education complete, DC therapy    Frequency     Barriers to discharge        Co-evaluation               AM-PAC PT "6 Clicks" Mobility  Outcome Measure Help needed turning from your back to your side while in a flat bed without using bedrails?: None Help needed  moving from lying on your back to sitting on the side of a flat bed without using bedrails?: None Help needed moving to and from a bed to a chair (including a wheelchair)?: None Help needed standing up from a chair using your arms (e.g., wheelchair or bedside chair)?: None Help needed to walk in hospital room?: None Help needed climbing 3-5 steps with a railing? : None 6 Click Score: 24    End of Session   Activity Tolerance: Patient tolerated treatment well Patient left: with call bell/phone within reach (Sitting up EOB) Nurse Communication: Mobility status PT Visit Diagnosis: Unsteadiness on feet (R26.81)    Time: 1314-3888 PT Time Calculation (min) (ACUTE ONLY): 19 min   Charges:     PT Treatments $Gait Training: 8-22 mins        Rolinda Roan, PT, DPT Acute Rehabilitation Services Pager: [phone] Office: [phone]   Thelma Comp 01/28/2020, 2:25 PM

## 2020-01-28 NOTE — Discharge Summary (Signed)
Physician Discharge Summary  Patient ID: Adam Sherman MRN: [id number] DOB/AGE: 01-05-1946 74 y.o.  Admit date: 01/27/2020 Discharge date: 01/28/2020  Admission Diagnoses: Herniation of lumbar intervertebral disc without myelopathy, radiculopathy, lumbago, lumbar spinal stenosis, spondylosis L 34 Left  Discharge Diagnoses: Herniation of lumbar intervertebral disc without myelopathy, radiculopathy, lumbago, lumbar spinal stenosis, spondylosis L 34 Left Active Problems:   Herniated lumbar disc without myelopathy   Discharged Condition: good  Hospital Course: The patient was diagnosed with herniation of lumbar intervertebral disc without myelopathy, radiculopathy, lumbago, lumbar spinal stenosis, spondylosis L 34 Left. It was elected to take him to surgery for a Left Lumbar 3-4 Microdiscectomy. He underwent an uncomplicated surgery and was extubated in the OR. He was taken to PACU in stable condition for recovery. He was then transferred to [facility] for overnight observation. At this point he is stable and is ready to be discharged home.   Consults: None  Significant Diagnostic Studies: radiology: X-rays  Treatments: surgery: Left Lumbar 3-4 Microdiscectomy   Discharge Exam: Blood pressure (!) 152/98, pulse (!) 113, temperature 97.8 F (36.6 C), temperature source Oral, resp. rate 18, height 5\' 8"  (1.727 m), weight 88.5 kg, SpO2 97 %.  Physical Exam: Patient is A/O X4, in good spirits, and is conversant. He has full strength in his bilateral lower extremities that is symmetric bilaterally. Sensation is intact. Dressing is CDI. Incision is well approximated with no drainage, erythema, or edema.   Disposition: Discharge disposition: 01-Home or Self Care          Signed: Marvis Moeller, DNP, NP-C 01/28/2020, 9:11 AM

## 2020-01-28 NOTE — Progress Notes (Signed)
Subjective: Patient reports that he is doing well this morning and has had a resolution of his preoperative symptoms. He stated that he has ambulated down the hall multiple times without difficulty and feels as though he is ready for discharge. No acute event were reported overnight.   Objective: Vital signs in last 24 hours: Temp:  [97.6 F (36.4 C)-98.3 F (36.8 C)] 97.8 F (36.6 C) (08/18 0759) Pulse Rate:  [72-113] 113 (08/18 0759) Resp:  [12-20] 18 (08/18 0759) BP: (98-152)/(76-98) 152/98 (08/18 0759) SpO2:  [93 %-98 %] 97 % (08/18 0759) Weight:  [88.5 kg] 88.5 kg (08/17 1358)  Intake/Output from previous day: 08/17 0701 - 08/18 0700 In: 1300 [I.V.:1200; IV Piggyback:100] Out: 25 [Blood:25] Intake/Output this shift: No intake/output data recorded.  Physical Exam:  Patient is A/O X4, in good spirits, and is conversant. He has full strength in his bilateral lower extremities that is symmetric bilaterally. Sensation is intact. Dressing is CDI. Incision is well approximated with no drainage, erythema, or edema.   Lab Results: Recent Labs    01/27/20 1406  WBC 5.4  HGB 15.2  HCT 45.8  PLT 197   BMET Recent Labs    01/27/20 1406  NA 140  K 3.4*  CL 99  CO2 30  GLUCOSE 102*  BUN 15  CREATININE 1.09  CALCIUM 9.7    Studies/Results: DG Lumbar Spine 2-3 Views  Result Date: 01/27/2020 CLINICAL DATA:  Provided history: Surgery, elective. Localization for L3-4 micro discectomy. EXAM: LUMBAR SPINE - 2-3 VIEW COMPARISON:  Lumbar spine MRI 01/16/2020 FINDINGS: Two intraoperative lateral view radiographs of the lumbar spine are submitted. On the first image, a metallic site marker projects posterior to the L3 spinous process. On the second image, a metallic site marker projects posterior to the L3 inferior articular processes. Multilevel disc space narrowing. Most notably, there is moderate/advanced disc space narrowing at L5-S1. Mild L2-L3 grade 1 retrolisthesis. Multilevel  degenerative endplate spurring. Mild multilevel facet arthrosis, greatest within the lower lumbar spine. IMPRESSION: Two intraoperative lateral view radiographs of the lumbar spine as described. Electronically Signed   By: Kellie Simmering DO   On: 01/27/2020 20:49    Assessment/Plan: Patient is recovering well from his surgery. He reports a resolution of his preoperative symptoms and only has complaints of mild incisional soreness. Continue working on mobilization and ambulation. Work with PT/OT. Plan to discharge patient to home today.   LOS: 0 days     Adam Sherman 01/28/2020, 9:06 AM

## 2020-02-12 ENCOUNTER — Other Ambulatory Visit: Payer: Self-pay

## 2020-02-12 ENCOUNTER — Ambulatory Visit (AMBULATORY_SURGERY_CENTER): Payer: Self-pay

## 2020-02-12 VITALS — Ht 68.0 in | Wt 195.0 lb

## 2020-02-12 DIAGNOSIS — Z86010 Personal history of colonic polyps: Secondary | ICD-10-CM

## 2020-02-12 NOTE — Progress Notes (Signed)
No egg or soy allergy known to patient  No issues with past sedation with any surgeries or procedures no intubation problems in the past  No FH of Malignant Hyperthermia No diet pills per patient No home 02 use per patient  No blood thinners per patient  Pt denies issues with constipation  No A fib or A flutter  EMMI video to pt or via [place] 19 guidelines implemented in PV today with Pt and RN   PT HAS BEEN VACCINATED FOR COVID  Due to the COVID-19 pandemic we are asking patients to follow these guidelines. Please only bring one care partner. Please be aware that your care partner may wait in the car in the parking lot or if they feel like they will be too hot to wait in the car, they may wait in the lobby on the 4th floor. All care partners are required to wear a mask the entire time (we do not have any that we can provide them), they need to practice social distancing, and we will do a Covid check for all patient's and care partners when you arrive. Also we will check their temperature and your temperature. If the care partner waits in their car they need to stay in the parking lot the entire time and we will call them on their cell phone when the patient is ready for discharge so they can bring the car to the front of the building. Also all patient's will need to wear a mask into building.

## 2020-03-08 DIAGNOSIS — E781 Pure hyperglyceridemia: Secondary | ICD-10-CM | POA: Diagnosis not present

## 2020-03-08 DIAGNOSIS — Z125 Encounter for screening for malignant neoplasm of prostate: Secondary | ICD-10-CM | POA: Diagnosis not present

## 2020-03-08 DIAGNOSIS — I10 Essential (primary) hypertension: Secondary | ICD-10-CM | POA: Diagnosis not present

## 2020-03-10 DIAGNOSIS — Z0000 Encounter for general adult medical examination without abnormal findings: Secondary | ICD-10-CM | POA: Diagnosis not present

## 2020-03-10 DIAGNOSIS — K219 Gastro-esophageal reflux disease without esophagitis: Secondary | ICD-10-CM | POA: Diagnosis not present

## 2020-03-10 DIAGNOSIS — I10 Essential (primary) hypertension: Secondary | ICD-10-CM | POA: Diagnosis not present

## 2020-03-12 HISTORY — PX: COLONOSCOPY: SHX174

## 2020-03-19 ENCOUNTER — Encounter: Payer: Self-pay | Admitting: Gastroenterology

## 2020-03-19 ENCOUNTER — Other Ambulatory Visit: Payer: Self-pay

## 2020-03-19 ENCOUNTER — Ambulatory Visit (AMBULATORY_SURGERY_CENTER): Payer: Medicare PPO | Admitting: Gastroenterology

## 2020-03-19 VITALS — BP 121/77 | HR 69 | Temp 98.0°F | Resp 15 | Ht 68.0 in | Wt 195.0 lb

## 2020-03-19 DIAGNOSIS — D125 Benign neoplasm of sigmoid colon: Secondary | ICD-10-CM

## 2020-03-19 DIAGNOSIS — D120 Benign neoplasm of cecum: Secondary | ICD-10-CM | POA: Diagnosis not present

## 2020-03-19 DIAGNOSIS — D122 Benign neoplasm of ascending colon: Secondary | ICD-10-CM | POA: Diagnosis not present

## 2020-03-19 DIAGNOSIS — Z860100 Personal history of colon polyps, unspecified: Secondary | ICD-10-CM

## 2020-03-19 DIAGNOSIS — D123 Benign neoplasm of transverse colon: Secondary | ICD-10-CM

## 2020-03-19 MED ORDER — SODIUM CHLORIDE 0.9 % IV SOLN: 500.0000 mL | Freq: Once | INTRAVENOUS | Status: DC

## 2020-03-19 NOTE — Progress Notes (Signed)
PT taken to PACU. Monitors in place. VSS. Report given to RN. 

## 2020-03-19 NOTE — Progress Notes (Signed)
Called to room to assist during endoscopic procedure.  Patient ID and intended procedure confirmed with present staff. Received instructions for my participation in the procedure from the performing physician.  

## 2020-03-19 NOTE — Op Note (Signed)
[facility] Patient Name: Brianna Esson Procedure Date: 03/19/2020 2:50 PM MRN: [id number] Endoscopist: Milus Banister , MD Age: 74 Referring MD:  Date of Birth: [birth year] Gender: Male Account #: [id number] Procedure:                Colonoscopy Indications:              High risk colon cancer surveillance: Personal                            history of colonic polyps; 2018 colonoscopy 4 subCM                            polyps (mixed TAs and SSP) Medicines:                Monitored Anesthesia Care Procedure:                Pre-Anesthesia Assessment:                           - Prior to the procedure, a History and Physical                            was performed, and patient medications and                            allergies were reviewed. The patient's tolerance of                            previous anesthesia was also reviewed. The risks                            and benefits of the procedure and the sedation                            options and risks were discussed with the patient.                            All questions were answered, and informed consent                            was obtained. Prior Anticoagulants: The patient has                            taken no previous anticoagulant or antiplatelet                            agents. ASA Grade Assessment: II - A patient with                            mild systemic disease. After reviewing the risks                            and benefits, the patient was deemed in  satisfactory condition to undergo the procedure.                           After obtaining informed consent, the colonoscope                            was passed under direct vision. Throughout the                            procedure, the patient's blood pressure, pulse, and                            oxygen saturations were monitored continuously. The                            Colonoscope was introduced through  the anus and                            advanced to the the cecum, identified by                            appendiceal orifice and ileocecal valve. The                            colonoscopy was performed without difficulty. The                            patient tolerated the procedure well. The quality                            of the bowel preparation was good. The ileocecal                            valve, appendiceal orifice, and rectum were                            photographed. Scope In: 2:56:24 PM Scope Out: 3:12:45 PM Scope Withdrawal Time: 0 hours 13 minutes 15 seconds  Total Procedure Duration: 0 hours 16 minutes 21 seconds  Findings:                 Five sessile polyps were found in the sigmoid                            colon, transverse colon, ascending colon and cecum.                            The polyps were 2 to 5 mm in size. These polyps                            were removed with a cold snare. Resection and                            retrieval were complete.  Multiple small-mouthed diverticula were found in                            the left colon.                           External and internal hemorrhoids were found. The                            hemorrhoids were small.                           The exam was otherwise without abnormality on                            direct and retroflexion views. Complications:            No immediate complications. Estimated blood loss:                            None. Estimated Blood Loss:     Estimated blood loss: none. Impression:               - Five 2 to 5 mm polyps in the sigmoid colon, in                            the transverse colon, in the ascending colon and in                            the cecum, removed with a cold snare. Resected and                            retrieved.                           - Diverticulosis in the left colon.                           - External and  internal hemorrhoids.                           - The examination was otherwise normal on direct                            and retroflexion views. Recommendation:           - Patient has a contact number available for                            emergencies. The signs and symptoms of potential                            delayed complications were discussed with the                            patient. Return to normal activities tomorrow.  Written discharge instructions were provided to the                            patient.                           - Resume previous diet.                           - Continue present medications.                           - Await pathology results. Milus Banister, MD 03/19/2020 3:15:50 PM This report has been signed electronically.

## 2020-03-19 NOTE — Patient Instructions (Signed)
Read all of the handouts given to you by your recovery room nurse.  Thank-you for choosing us for your healthcare needs today.  YOU HAD AN ENDOSCOPIC PROCEDURE TODAY AT THE [facility]:   Refer to the procedure report that was given to you for any specific questions about what was found during the examination.  If the procedure report does not answer your questions, please call your gastroenterologist to clarify.  If you requested that your care partner not be given the details of your procedure findings, then the procedure report has been included in a sealed envelope for you to review at your convenience later.  YOU SHOULD EXPECT: Some feelings of bloating in the abdomen. Passage of more gas than usual.  Walking can help get rid of the air that was put into your GI tract during the procedure and reduce the bloating. If you had a lower endoscopy (such as a colonoscopy or flexible sigmoidoscopy) you may notice spotting of blood in your stool or on the toilet paper. If you underwent a bowel prep for your procedure, you may not have a normal bowel movement for a few days.  Please Note:  You might notice some irritation and congestion in your nose or some drainage.  This is from the oxygen used during your procedure.  There is no need for concern and it should clear up in a day or so.  SYMPTOMS TO REPORT IMMEDIATELY:   Following lower endoscopy (colonoscopy or flexible sigmoidoscopy):  Excessive amounts of blood in the stool  Significant tenderness or worsening of abdominal pains  Swelling of the abdomen that is new, acute  Fever of 100F or higher   For urgent or emergent issues, a gastroenterologist can be reached at any hour by calling [phone]. Do not use MyChart messaging for urgent concerns.    DIET:  We do recommend a small meal at first, but then you may proceed to your regular diet.  Drink plenty of fluids but you should avoid alcoholic beverages for 24 hours. Try to  increase the fiber in your diet, and drink plenty of water.  ACTIVITY:  You should plan to take it easy for the rest of today and you should NOT DRIVE or use heavy machinery until tomorrow (because of the sedation medicines used during the test).    FOLLOW UP: Our staff will call the number listed on your records 48-72 hours following your procedure to check on you and address any questions or concerns that you may have regarding the information given to you following your procedure. If we do not reach you, we will leave a message.  We will attempt to reach you two times.  During this call, we will ask if you have developed any symptoms of COVID 19. If you develop any symptoms (ie: fever, flu-like symptoms, shortness of breath, cough etc.) before then, please call [phone].  If you test positive for Covid 19 in the 2 weeks post procedure, please call and report this information to us.    If any biopsies were taken you will be contacted by phone or by letter within the next 1-3 weeks.  Please call us at [phone] if you have not heard about the biopsies in 3 weeks.    SIGNATURES/CONFIDENTIALITY: You and/or your care partner have signed paperwork which will be entered into your electronic medical record.  These signatures attest to the fact that that the information above on your After Visit Summary has been reviewed   and is understood.  Full responsibility of the confidentiality of this discharge information lies with you and/or your care-partner. 

## 2020-03-19 NOTE — Progress Notes (Signed)
Pt's states no medical or surgical changes since previsit or office visit. 

## 2020-03-23 ENCOUNTER — Telehealth: Payer: Self-pay

## 2020-03-23 NOTE — Telephone Encounter (Signed)
  Follow up Call-  Call back number 03/19/2020  Post procedure Call Back phone  # [phone]  Permission to leave phone message Yes  Some recent data might be hidden     Patient questions:  Do you have a fever, pain , or abdominal swelling? No. Pain Score  0 *  Have you tolerated food without any problems? Yes.    Have you been able to return to your normal activities? Yes.    Do you have any questions about your discharge instructions: Diet   No. Medications  No. Follow up visit  No.  Do you have questions or concerns about your Care? No.  Actions: * If pain score is 4 or above: No action needed, pain <4.

## 2020-03-29 ENCOUNTER — Encounter: Payer: Self-pay | Admitting: Gastroenterology

## 2020-05-13 ENCOUNTER — Other Ambulatory Visit: Payer: Self-pay

## 2020-05-13 ENCOUNTER — Ambulatory Visit: Payer: Medicare PPO | Admitting: Dermatology

## 2020-05-13 DIAGNOSIS — L578 Other skin changes due to chronic exposure to nonionizing radiation: Secondary | ICD-10-CM

## 2020-05-13 DIAGNOSIS — L820 Inflamed seborrheic keratosis: Secondary | ICD-10-CM

## 2020-05-13 DIAGNOSIS — L570 Actinic keratosis: Secondary | ICD-10-CM | POA: Diagnosis not present

## 2020-05-13 NOTE — Progress Notes (Signed)
   Follow-Up Visit   Subjective  Adam Sherman is a 74 y.o. male who presents for the following: Actinic Keratosis (scalp, face, ears, >85m f/u LN2 x 21 last visit) and ISK f/u (hands/arms > 37m f/u, last vist LN2 x 16).  Patient accompanied by wife who contributes to history.  The following portions of the chart were reviewed this encounter and updated as appropriate:  Tobacco  Allergies  Meds  Problems  Med Hx  Surg Hx  Fam Hx     Review of Systems:  No other skin or systemic complaints except as noted in HPI or Assessment and Plan.  Objective  Well appearing patient in no apparent distress; mood and affect are within normal limits.  A focused examination was performed including face, scalp, ears, hands, arms. Relevant physical exam findings are noted in the Assessment and Plan.  Objective  scalp x 6, face x 7 (13): Pink scaly macules   Objective  L temple/sideburn x 2, R forearm x 1 (3): Erythematous keratotic or waxy stuck-on papule or plaque.    Assessment & Plan  AK (actinic keratosis) (13) scalp x 6, face x 7  Destruction of lesion - scalp x 6, face x 7 Complexity: simple   Destruction method: cryotherapy   Informed consent: discussed and consent obtained   Timeout:  patient name, date of birth, surgical site, and procedure verified Lesion destroyed using liquid nitrogen: Yes   Region frozen until ice ball extended beyond lesion: Yes   Outcome: patient tolerated procedure well with no complications   Post-procedure details: wound care instructions given    Inflamed seborrheic keratosis (3) L temple/sideburn x 2, R forearm x 1  Destruction of lesion - L temple/sideburn x 2, R forearm x 1 Complexity: simple   Destruction method: cryotherapy   Informed consent: discussed and consent obtained   Timeout:  patient name, date of birth, surgical site, and procedure verified Lesion destroyed using liquid nitrogen: Yes   Region frozen until ice ball extended  beyond lesion: Yes   Outcome: patient tolerated procedure well with no complications   Post-procedure details: wound care instructions given     Actinic Damage - chronic, secondary to cumulative UV radiation exposure/sun exposure over time - diffuse scaly erythematous macules with underlying dyspigmentation - Recommend daily broad spectrum sunscreen SPF 30+ to sun-exposed areas, reapply every 2 hours as needed.  - Call for new or changing lesions.  Return for 4-73m for TBSE.  I, Othelia Pulling, RMA, am acting as scribe for Sarina Ser, MD .  Documentation: I have reviewed the above documentation for accuracy and completeness, and I agree with the above.  Sarina Ser, MD

## 2020-05-19 ENCOUNTER — Encounter: Payer: Self-pay | Admitting: Dermatology

## 2020-06-09 DIAGNOSIS — M48062 Spinal stenosis, lumbar region with neurogenic claudication: Secondary | ICD-10-CM | POA: Diagnosis not present

## 2020-06-09 DIAGNOSIS — M4726 Other spondylosis with radiculopathy, lumbar region: Secondary | ICD-10-CM | POA: Diagnosis not present

## 2020-06-09 DIAGNOSIS — M5136 Other intervertebral disc degeneration, lumbar region: Secondary | ICD-10-CM | POA: Diagnosis not present

## 2020-06-23 ENCOUNTER — Institutional Professional Consult (permissible substitution): Payer: Medicare PPO | Admitting: Internal Medicine

## 2020-09-27 ENCOUNTER — Ambulatory Visit: Payer: Medicare PPO | Admitting: Internal Medicine

## 2020-10-25 ENCOUNTER — Other Ambulatory Visit (HOSPITAL_COMMUNITY)
Admission: RE | Admit: 2020-10-25 | Discharge: 2020-10-25 | Disposition: A | Discharge status: Home / Self Care | Payer: Medicare PPO | Source: Ambulatory Visit | Attending: Internal Medicine | Admitting: Internal Medicine

## 2020-10-25 ENCOUNTER — Ambulatory Visit (INDEPENDENT_AMBULATORY_CARE_PROVIDER_SITE_OTHER): Payer: Medicare PPO | Admitting: Internal Medicine

## 2020-10-25 ENCOUNTER — Encounter: Payer: Self-pay | Admitting: Internal Medicine

## 2020-10-25 ENCOUNTER — Other Ambulatory Visit: Payer: Self-pay

## 2020-10-25 ENCOUNTER — Ambulatory Visit (HOSPITAL_COMMUNITY)
Admission: RE | Admit: 2020-10-25 | Discharge: 2020-10-25 | Disposition: A | Discharge status: Home / Self Care | Payer: Medicare PPO | Source: Ambulatory Visit | Attending: Internal Medicine | Admitting: Internal Medicine

## 2020-10-25 DIAGNOSIS — R9389 Abnormal findings on diagnostic imaging of other specified body structures: Secondary | ICD-10-CM | POA: Diagnosis not present

## 2020-10-25 DIAGNOSIS — R058 Other specified cough: Secondary | ICD-10-CM

## 2020-10-25 DIAGNOSIS — R059 Cough, unspecified: Secondary | ICD-10-CM | POA: Diagnosis not present

## 2020-10-25 LAB — CBC WITH DIFFERENTIAL/PLATELET
Abs Immature Granulocytes: 0.01 10*3/uL (ref 0.00–0.07)
Basophils Absolute: 0.1 10*3/uL (ref 0.0–0.1)
Basophils Relative: 2 %
Eosinophils Absolute: 0.1 10*3/uL (ref 0.0–0.5)
Eosinophils Relative: 3 %
HCT: 47.9 % (ref 39.0–52.0)
Hemoglobin: 15.6 g/dL (ref 13.0–17.0)
Immature Granulocytes: 0 %
Lymphocytes Relative: 40 %
Lymphs Abs: 1.7 10*3/uL (ref 0.7–4.0)
MCH: 29.8 pg (ref 26.0–34.0)
MCHC: 32.6 g/dL (ref 30.0–36.0)
MCV: 91.6 fL (ref 80.0–100.0)
Monocytes Absolute: 0.6 10*3/uL (ref 0.1–1.0)
Monocytes Relative: 14 %
Neutro Abs: 1.7 10*3/uL (ref 1.7–7.7)
Neutrophils Relative %: 41 %
Platelets: 202 10*3/uL (ref 150–400)
RBC: 5.23 MIL/uL (ref 4.22–5.81)
RDW: 13 % (ref 11.5–15.5)
WBC: 4.1 10*3/uL (ref 4.0–10.5)
nRBC: 0 % (ref 0.0–0.2)

## 2020-10-25 IMAGING — DX DG CHEST 2V
2 series · 2 of 2 positions shown · non-contrast
Comparison: [DATE]

CLINICAL DATA: 74-year-old male with a history cough and congestion

EXAM:
CHEST - 2 VIEW

[chest pa]
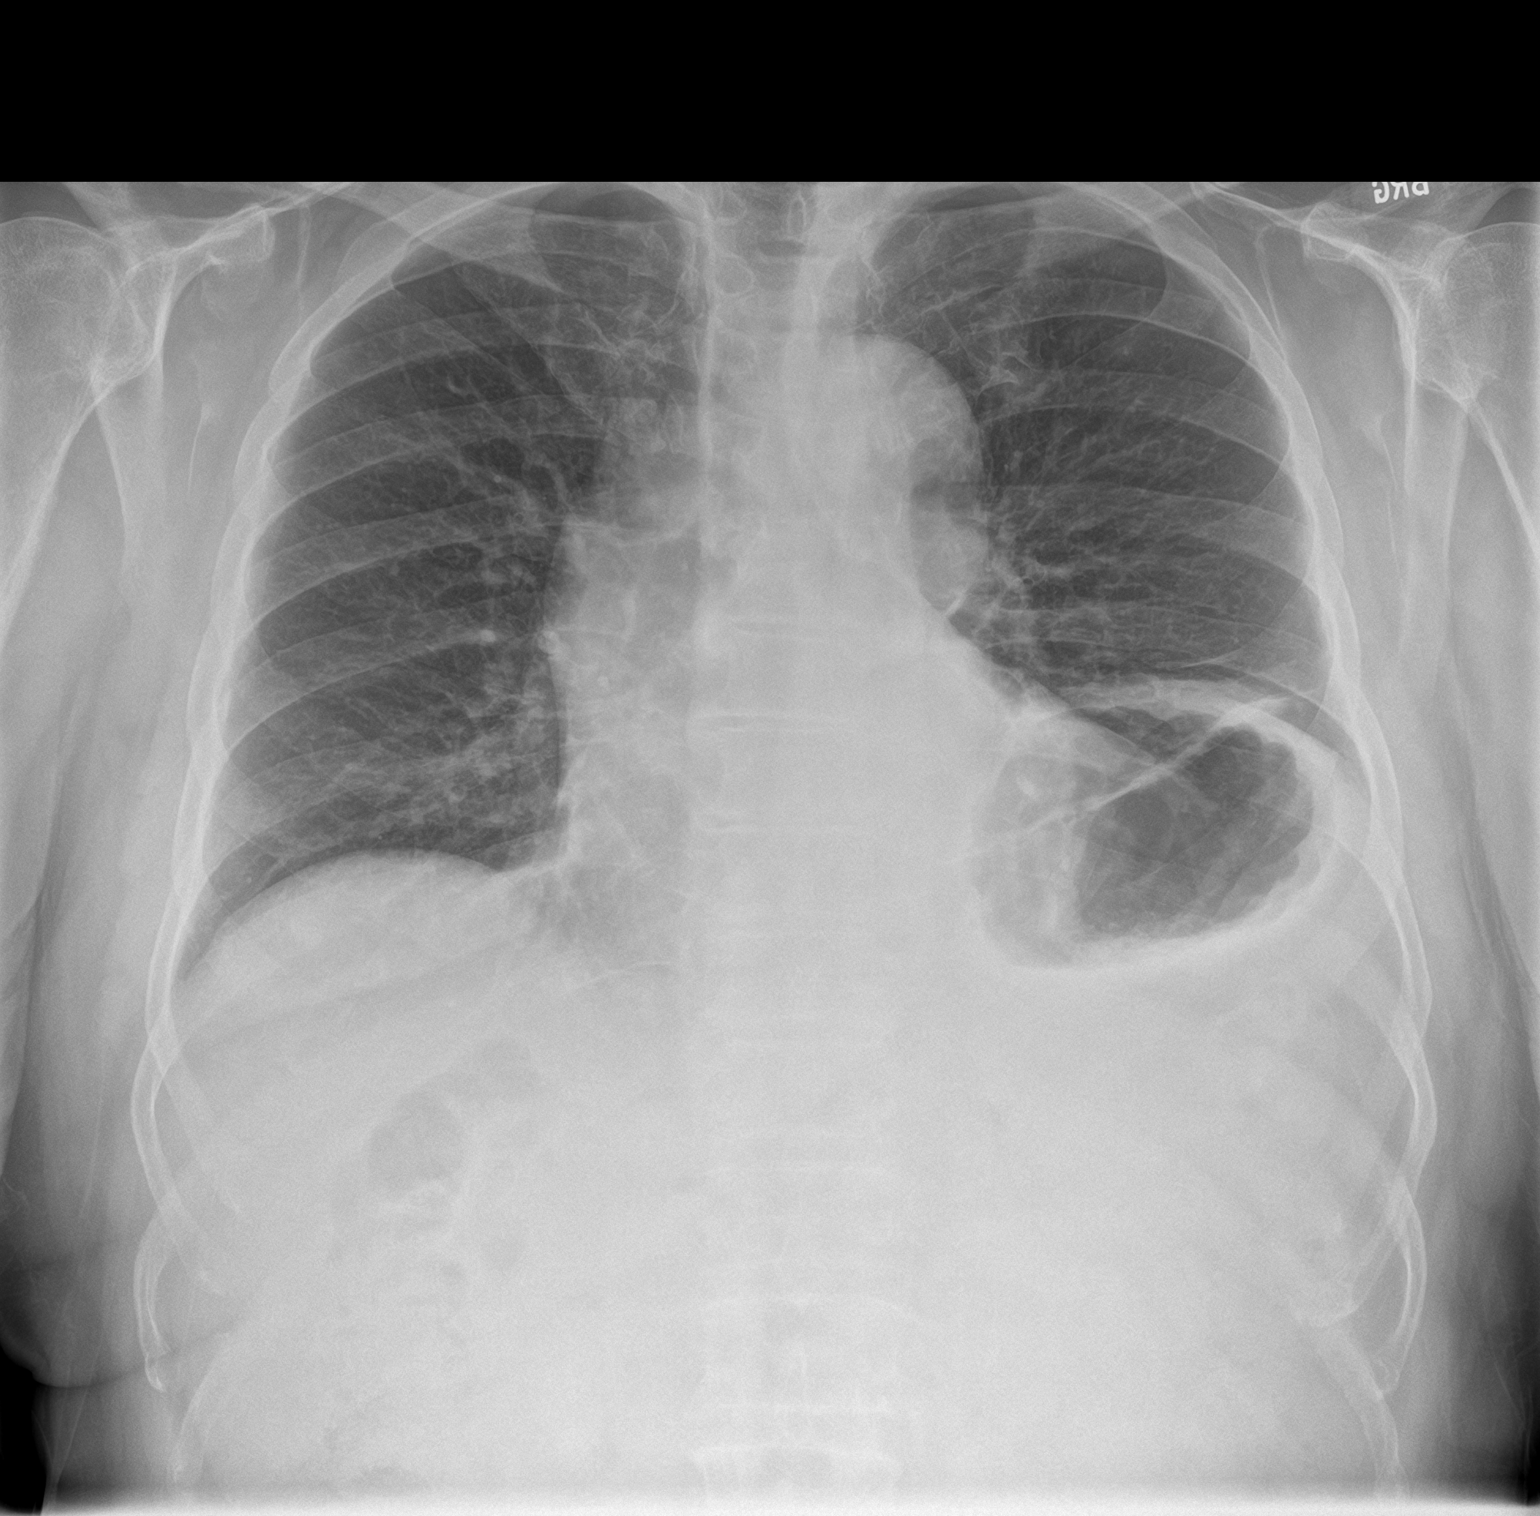

[chest lat]
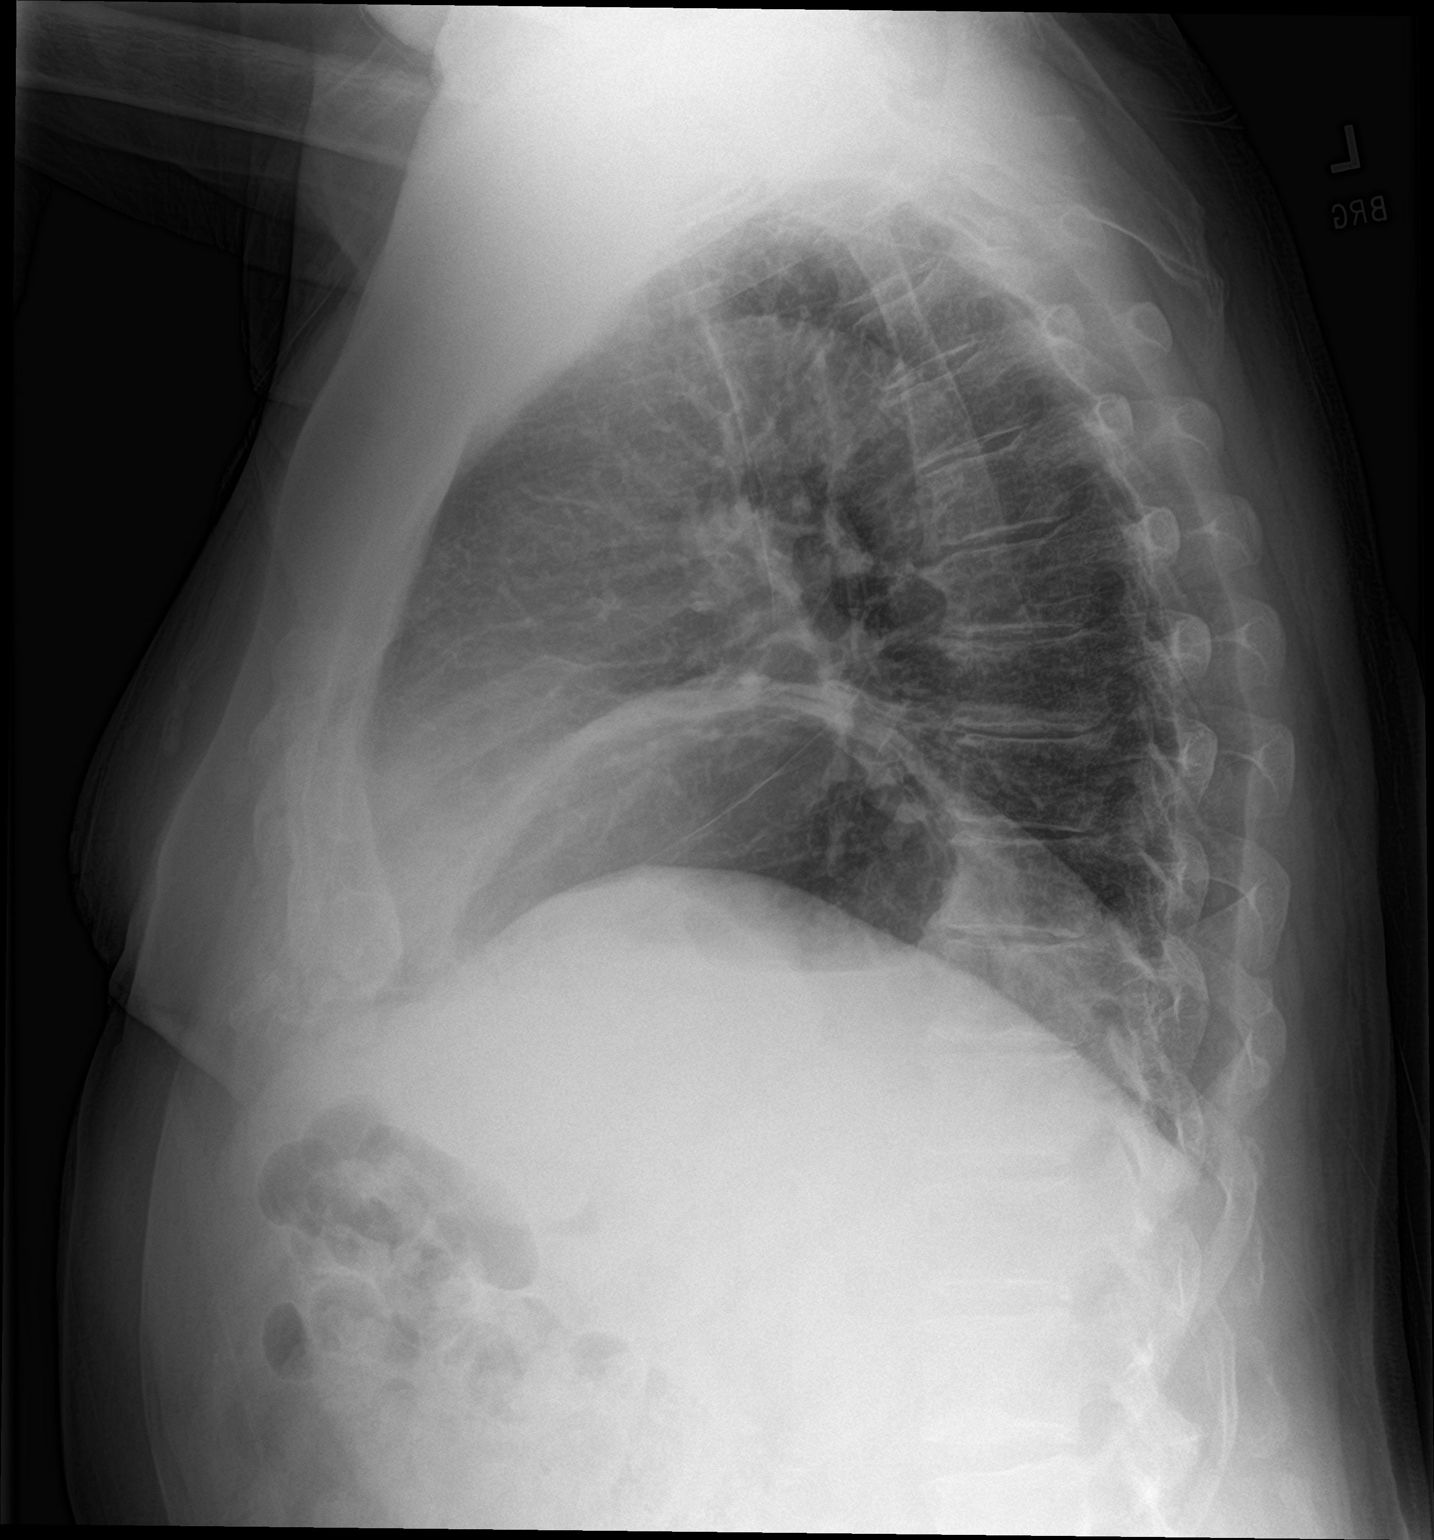

[2 of 2 positions shown; findings below may reference images not displayed]

FINDINGS: Cardiomediastinal silhouette unchanged in size and contour. No
evidence of central vascular congestion.

Mild reticulonodular opacities are new from the comparison. No
pneumothorax pleural effusion or confluent airspace disease.
Asymmetric elevation of the left hemidiaphragm with linear opacities
at the left lung base.

No displaced fracture.  Degenerative changes of the spine
IMPRESSION: Mild reticulonodular opacities of the lungs compared to [J0],
potentially atypical infection versus developing scarring/fibrosis.

Asymmetric elevation of left hemidiaphragm with scarring/atelectasis
at the left lung base.

## 2020-10-25 MED ORDER — FAMOTIDINE 20 MG PO TABS: ORAL_TABLET | ORAL | 11 refills | Status: DC

## 2020-10-25 MED ORDER — OMEPRAZOLE 40 MG PO CPDR: DELAYED_RELEASE_CAPSULE | ORAL | 2 refills | Status: AC

## 2020-10-25 NOTE — Progress Notes (Signed)
Adam Sherman, male    DOB: [birth year],   MRN: [id number]   Brief patient profile:  41 yowm quit smoking 1972 s sequelae but about 2005 dx with asthma in [place] and rx with advair but lung function was nl off it 03/08/16 @ [facility] eval > rec elevate HOB and stop the advair and did fine until early spring 2022 with new c/o  am congestion /urge to clear the throat so self referred to pulmonary clinic 10/25/2020    History of Present Illness  10/25/2020  Pulmonary/ 1st office eval/ Gillie Fleites / [place] [facility]  Chief Complaint  Patient presents with  . Pulmonary Consult    Self referral- c/o waking up with congestion and cough every am for the past several months. Cough is prod with clear sputum. He also c/o DOE if he walks fast.   Dyspnea:  Really Not limited by breathing from desired activities   Cough: not aware of it at night /  Worse about an hour each am until he clears out sev tsp of white mucus assoc with subj wheeze  Sleep: 4 in bed blocks / prilosec 20 mg q d  SABA use:  Not sure it helps at all   No obvious day to day or daytime variability or assoc excess/ purulent sputum or mucus plugs or hemoptysis or cp or chest tightness,  or overt  hb symptoms on ppi = prilosec 20 mg each am not timed with meals   Sleeping  without nocturnal  or early am exacerbation  of respiratory  c/o's or need for noct saba. Also denies any obvious fluctuation of symptoms with weather or environmental changes or other aggravating or alleviating factors except as outlined above   No unusual exposure hx or h/o childhood pna/ asthma or knowledge of premature birth.  Current Allergies, Complete Past Medical History, Past Surgical History, Family History, and Social History were reviewed in [employer] record.  ROS  The following are not active complaints unless bolded Hoarseness, sore throat, dysphagia, dental problems, itching, sneezing,  nasal congestion or discharge of excess mucus  or purulent secretions, ear ache,   fever, chills, sweats, unintended wt loss or wt gain, classically pleuritic or exertional cp,  orthopnea pnd or arm/hand swelling  or leg swelling, presyncope, palpitations, abdominal pain, anorexia, nausea, vomiting, diarrhea  or change in bowel habits or change in bladder habits, change in stools or change in urine, dysuria, hematuria,  rash, arthralgias, visual complaints, headache, numbness, weakness or ataxia or problems with walking or coordination,  change in mood or  memory.           Past Medical History:  Diagnosis Date  . Arthritis    thumb  . Asthma   . Cataract   . GERD (gastroesophageal reflux disease)   . History of kidney stones   . Hypertension   . Squamous cell carcinoma of skin 08/05/2018   right dorsum of hand    Outpatient Medications Prior to Visit  Medication Sig Dispense Refill  . ALBUTEROL IN Inhale 2 puffs into the lungs every 4 (four) hours as needed.    Marland Kitchen amLODipine (NORVASC) 5 MG tablet Take 5 mg by mouth daily.    Marland Kitchen losartan-hydrochlorothiazide (HYZAAR) 100-25 MG per tablet Take 1 tablet by mouth daily.    . Omega-3 Fatty Acids (FISH OIL) 1200 MG CAPS Take 1,200 mg by mouth daily.    Marland Kitchen omeprazole (PRILOSEC) 20 MG capsule Take 20 mg by mouth daily.    Marland Kitchen  vitamin E 400 UNIT capsule Take 400 Units by mouth daily.    . cyclobenzaprine (FLEXERIL) 10 MG tablet Take 10 mg by mouth at bedtime. As needed    . Glucosamine HCl-MSM (GLUCOSAMINE-MSM PO) Take 1 tablet by mouth daily.    Marland Kitchen oxyCODONE-acetaminophen (PERCOCET/ROXICET) 5-325 MG tablet Take 1 tablet by mouth See admin instructions. Take 1 tablet every morning, may take 1 tablet every 6 hours as needed for pain (Patient not taking: Reported on 02/12/2020)     No facility-administered medications prior to visit.     Objective:     BP (!) 144/90 (BP Location: Left Arm, Cuff Size: Normal)   Pulse 82   Temp (!) 97.2 F (36.2 C) (Temporal)   Ht 5\' 8"  (1.727 m)   Wt 196 lb  (88.9 kg)   SpO2 97% Comment: on RA  BMI 29.80 kg/m   SpO2: 97 % (on RA)   amb wm freq throat clearing    HEENT : pt wearing mask not removed for exam due to covid -19 concerns.    NECK :  without JVD/Nodes/TM/ nl carotid upstrokes bilaterally   LUNGS: no acc muscle use,  Nl contour chest which is clear to A and P bilaterally without cough on insp or exp maneuvers   CV:  RRR  no s3 or murmur or increase in P2, and no edema   ABD:  soft and nontender with nl inspiratory excursion in the supine position. No bruits or organomegaly appreciated, bowel sounds nl  MS:  Nl gait/ ext warm without deformities, calf tenderness, cyanosis or clubbing No obvious joint restrictions   SKIN: warm and dry without lesions    NEURO:  alert, approp, nl sensorium with  no motor or cerebellar deficits apparent.     CXR PA and Lateral:   10/25/2020 :    I personally reviewed images and agree with radiology impression as follows:   Mild reticulonodular opacities of the lungs compared to 2012, potentially atypical infection versus developing scarring/fibrosis. Asymmetric elevation of left hemidiaphragm with scarring/atelectasis at the left lung base.     Labs ordered 10/25/2020  :  allergy profile          Assessment   Upper airway cough syndrome Onset early spring 2022 -  Allergy profile 10/25/2020 >  Eos 0. /  IgE  Pending   Throat congestion just p stirs in am spont improves and not assoc with sleep disturbance is not typical of asthma but more c/w Upper airway cough syndrome (previously labeled PNDS),  is so named because it's frequently impossible to sort out how much is  CR/sinusitis with freq throat clearing (which can be related to primary GERD)   vs  causing  secondary (" extra esophageal")  GERD from wide swings in gastric pressure that occur with throat clearing, often  promoting self use of mint and menthol lozenges that reduce the lower esophageal sphincter tone and exacerbate the  problem further in a cyclical fashion.   These are the same pts (now being labeled as having "irritable larynx syndrome" by some cough centers) who not infrequently have a history of having failed to tolerate ace inhibitors,  dry powder inhalers or biphosphonates or report having atypical/extraesophageal reflux symptoms that don't respond to standard doses of PPI  and are easily confused as having aecopd or asthma flares by even experienced allergists/ pulmonologists (myself included).   >>> rec max rx for GERD x 6 weeks then return to regroup with all meds  in hand using a trust but verify approach to confirm accurate Medication  Reconciliation The principal here is that until we are certain that the  patients are doing what we've asked, it makes no sense to ask them to do more.    >>> based on cxr rec hrct chest/ sinus CT prior to next ov and sniff         Each maintenance medication was reviewed in detail including emphasizing most importantly the difference between maintenance and prns and under what circumstances the prns are to be triggered using an action plan format where appropriate.  Total time for H and P, chart review, counseling,  and generating customized AVS unique to this office visit / same day charting =45 min           Christinia Gully, MD 10/25/2020

## 2020-10-25 NOTE — Assessment & Plan Note (Addendum)
Onset early spring 2022 -  Allergy profile 10/25/2020 >  Eos 0. /  IgE  Pending   Throat congestion just p stirs in am spont improves and not assoc with sleep disturbance is not typical of asthma but more c/w Upper airway cough syndrome (previously labeled PNDS),  is so named because it's frequently impossible to sort out how much is  CR/sinusitis with freq throat clearing (which can be related to primary GERD)   vs  causing  secondary (" extra esophageal")  GERD from wide swings in gastric pressure that occur with throat clearing, often  promoting self use of mint and menthol lozenges that reduce the lower esophageal sphincter tone and exacerbate the problem further in a cyclical fashion.   These are the same pts (now being labeled as having "irritable larynx syndrome" by some cough centers) who not infrequently have a history of having failed to tolerate ace inhibitors,  dry powder inhalers or biphosphonates or report having atypical/extraesophageal reflux symptoms that don't respond to standard doses of PPI  and are easily confused as having aecopd or asthma flares by even experienced allergists/ pulmonologists (myself included).   >>> rec max rx for GERD x 6 weeks then return to regroup with all meds in hand using a trust but verify approach to confirm accurate Medication  Reconciliation The principal here is that until we are certain that the  patients are doing what we've asked, it makes no sense to ask them to do more.    >>> based on cxr rec hrct chest/ sinus CT prior to next ov and sniff         Each maintenance medication was reviewed in detail including emphasizing most importantly the difference between maintenance and prns and under what circumstances the prns are to be triggered using an action plan format where appropriate.  Total time for H and P, chart review, counseling,  and generating customized AVS unique to this office visit / same day charting =45 min

## 2020-10-25 NOTE — Patient Instructions (Addendum)
Change Omeprazole to  40 mg (20 x 2)   Take  30-60 min before first meal of the day and Pepcid (famotidine)  20 mg  At bedtime until return to office - this is the best way to tell whether stomach acid is contributing to your problem.    GERD (REFLUX)  is an extremely common cause of respiratory symptoms just like yours , many times with no obvious heartburn at all.    It can be treated with medication, but also with lifestyle changes including elevation of the head of your bed (ideally with 8inch blocks under the headboard of your bed),  Smoking cessation, avoidance of late meals, excessive alcohol, and avoid fatty foods, chocolate, peppermint, colas, red wine, and acidic juices such as orange juice.  NO MINT OR MENTHOL PRODUCTS SO NO COUGH DROPS  USE SUGARLESS CANDY INSTEAD (Jolley ranchers or Stover's or Life Savers) or even ice chips will also do - the key is to swallow to prevent all throat clearing.  NO OIL BASED VITAMINS - use powdered substitutes.  Avoid fish oil when coughing.   Please remember to go to the lab and x-ray department at [facility]   for your tests - we will call you with the results when they are available.  Please schedule a follow up office visit in 6 weeks, call sooner if needed      Add hrct chest/ sinus CT prior to next ov and sniff

## 2020-10-26 ENCOUNTER — Encounter: Payer: Self-pay | Admitting: Internal Medicine

## 2020-10-26 DIAGNOSIS — R9389 Abnormal findings on diagnostic imaging of other specified body structures: Secondary | ICD-10-CM | POA: Insufficient documentation

## 2020-10-26 NOTE — Progress Notes (Signed)
ATC the pt and there was no answer and no option to leave msg WCB.

## 2020-11-01 ENCOUNTER — Telehealth: Payer: Self-pay | Admitting: Internal Medicine

## 2020-11-01 ENCOUNTER — Encounter: Payer: Self-pay | Admitting: *Deleted

## 2020-11-01 DIAGNOSIS — J986 Disorders of diaphragm: Secondary | ICD-10-CM

## 2020-11-01 DIAGNOSIS — R0609 Other forms of dyspnea: Secondary | ICD-10-CM

## 2020-11-01 DIAGNOSIS — J84112 Idiopathic pulmonary fibrosis: Secondary | ICD-10-CM

## 2020-11-01 DIAGNOSIS — R0600 Dyspnea, unspecified: Secondary | ICD-10-CM

## 2020-11-01 DIAGNOSIS — R930 Abnormal findings on diagnostic imaging of skull and head, not elsewhere classified: Secondary | ICD-10-CM

## 2020-11-01 DIAGNOSIS — R9389 Abnormal findings on diagnostic imaging of other specified body structures: Secondary | ICD-10-CM

## 2020-11-01 DIAGNOSIS — R058 Other specified cough: Secondary | ICD-10-CM

## 2020-11-01 LAB — IGE: IgE (Immunoglobulin E), Serum: 66 IU/mL (ref 6–495)

## 2020-11-01 NOTE — Telephone Encounter (Signed)
Called and spoke with Adam Sherman per DPR to let her know about Chest xray results and that Dr. Melvyn Novas wants to order additional testing. She expressed understanding. Advised that they would get a call from our patient care coordinators. She expressed understanding. Orders have been placed. Nothing further needed at this time.

## 2020-11-22 ENCOUNTER — Other Ambulatory Visit: Payer: Self-pay

## 2020-11-22 ENCOUNTER — Ambulatory Visit: Payer: Medicare PPO | Admitting: Dermatology

## 2020-11-22 DIAGNOSIS — L820 Inflamed seborrheic keratosis: Secondary | ICD-10-CM

## 2020-11-22 DIAGNOSIS — Z85828 Personal history of other malignant neoplasm of skin: Secondary | ICD-10-CM | POA: Diagnosis not present

## 2020-11-22 DIAGNOSIS — L814 Other melanin hyperpigmentation: Secondary | ICD-10-CM | POA: Diagnosis not present

## 2020-11-22 DIAGNOSIS — L821 Other seborrheic keratosis: Secondary | ICD-10-CM

## 2020-11-22 DIAGNOSIS — L570 Actinic keratosis: Secondary | ICD-10-CM

## 2020-11-22 DIAGNOSIS — L578 Other skin changes due to chronic exposure to nonionizing radiation: Secondary | ICD-10-CM | POA: Diagnosis not present

## 2020-11-22 DIAGNOSIS — Z1283 Encounter for screening for malignant neoplasm of skin: Secondary | ICD-10-CM

## 2020-11-22 DIAGNOSIS — D229 Melanocytic nevi, unspecified: Secondary | ICD-10-CM

## 2020-11-22 DIAGNOSIS — D1800 Hemangioma unspecified site: Secondary | ICD-10-CM

## 2020-11-22 DIAGNOSIS — L918 Other hypertrophic disorders of the skin: Secondary | ICD-10-CM

## 2020-11-22 NOTE — Patient Instructions (Addendum)

## 2020-11-22 NOTE — Progress Notes (Signed)
Follow-Up Visit   Subjective  Adam Sherman is a 75 y.o. male who presents for the following: Annual Exam (Hx AK's, SCC ). The patient presents for Total-Body Skin Exam (TBSE) for skin cancer screening and mole check.  The following portions of the chart were reviewed this encounter and updated as appropriate:   Tobacco  Allergies  Meds  Problems  Med Hx  Surg Hx  Fam Hx      Review of Systems:  No other skin or systemic complaints except as noted in HPI or Assessment and Plan.  Objective  Well appearing patient in no apparent distress; mood and affect are within normal limits.  A full examination was performed including scalp, head, eyes, ears, nose, lips, neck, chest, axillae, abdomen, back, buttocks, bilateral upper extremities, bilateral lower extremities, hands, feet, fingers, toes, fingernails, and toenails. All findings within normal limits unless otherwise noted below.  Face, ears, and scalp x 18 (18) Erythematous thin papules/macules with gritty scale.   R hand and arm x 3, L arm x 2 Erythematous keratotic or waxy stuck-on papule or plaque.   Assessment & Plan  AK (actinic keratosis) (18) Face, ears, and scalp x 18  Destruction of lesion - Face, ears, and scalp x 18 Complexity: simple   Destruction method: cryotherapy   Informed consent: discussed and consent obtained   Timeout:  patient name, date of birth, surgical site, and procedure verified Lesion destroyed using liquid nitrogen: Yes   Region frozen until ice ball extended beyond lesion: Yes   Outcome: patient tolerated procedure well with no complications   Post-procedure details: wound care instructions given    Inflamed seborrheic keratosis R hand and arm x 3, L arm x 2  Destruction of lesion - R hand and arm x 3, L arm x 2 Complexity: simple   Destruction method: cryotherapy   Informed consent: discussed and consent obtained   Timeout:  patient name, date of birth, surgical site, and  procedure verified Lesion destroyed using liquid nitrogen: Yes   Region frozen until ice ball extended beyond lesion: Yes   Outcome: patient tolerated procedure well with no complications   Post-procedure details: wound care instructions given    Lentigines - Scattered tan macules - Due to sun exposure - Benign-appering, observe - Recommend daily broad spectrum sunscreen SPF 30+ to sun-exposed areas, reapply every 2 hours as needed. - Call for any changes  Seborrheic Keratoses - Stuck-on, waxy, tan-brown papules and/or plaques  - Benign-appearing - Discussed benign etiology and prognosis. - Observe - Call for any changes  Melanocytic Nevi - Tan-brown and/or pink-flesh-colored symmetric macules and papules - Benign appearing on exam today - Observation - Call clinic for new or changing moles - Recommend daily use of broad spectrum spf 30+ sunscreen to sun-exposed areas.   Hemangiomas - Red papules - Discussed benign nature - Observe - Call for any changes  Actinic Damage - Severe, confluent actinic changes with pre-cancerous actinic keratoses  - Severe, chronic, not at goal, secondary to cumulative UV radiation exposure over time - diffuse scaly erythematous macules and papules with underlying dyspigmentation - Discussed Prescription "Field Treatment" for Severe, Chronic Confluent Actinic Changes with Pre-Cancerous Actinic Keratoses Field treatment involves treatment of an entire area of skin that has confluent Actinic Changes (Sun/ Ultraviolet light damage) and PreCancerous Actinic Keratoses by method of PhotoDynamic Therapy (PDT) and/or prescription Topical Chemotherapy agents such as 5-fluorouracil, 5-fluorouracil/calcipotriene, and/or imiquimod.  The purpose is to decrease the number of clinically  evident and subclinical PreCancerous lesions to prevent progression to development of skin cancer by chemically destroying early precancer changes that may or may not be visible.  It  has been shown to reduce the risk of developing skin cancer in the treated area. As a result of treatment, redness, scaling, crusting, and open sores may occur during treatment course. One or more than one of these methods may be used and may have to be used several times to control, suppress and eliminate the PreCancerous changes. Discussed treatment course, expected reaction, and possible side effects. - Recommend daily broad spectrum sunscreen SPF 30+ to sun-exposed areas, reapply every 2 hours as needed.  - Staying in the shade or wearing long sleeves, sun glasses (UVA+UVB protection) and wide brim hats (4-inch brim around the entire circumference of the hat) are also recommended. - Call for new or changing lesions.  - Return for PDT of the face and scalp in the fall.   Acrochordons (Skin Tags) - Fleshy, skin-colored pedunculated papules - Benign appearing.  - Observe. - If desired, they can be removed with an in office procedure that is not covered by insurance. - Please call the clinic if you notice any new or changing lesions.  History of Squamous Cell Carcinoma of the Skin - R dorsum hand  - No evidence of recurrence today - No lymphadenopathy - Recommend regular full body skin exams - Recommend daily broad spectrum sunscreen SPF 30+ to sun-exposed areas, reapply every 2 hours as needed.  - Call if any new or changing lesions are noted between office visits  Skin cancer screening performed today.  Return in about 6 months (around 05/24/2021) for AK follow up . Return for PDT of the face and scalp in the fall.   Luther Redo, CMA, am acting as scribe for Sarina Ser, MD .  Documentation: I have reviewed the above documentation for accuracy and completeness, and I agree with the above.  Sarina Ser, MD

## 2020-11-23 ENCOUNTER — Encounter: Payer: Self-pay | Admitting: Dermatology

## 2020-11-26 ENCOUNTER — Ambulatory Visit (HOSPITAL_COMMUNITY)
Admission: RE | Admit: 2020-11-26 | Discharge: 2020-11-26 | Disposition: A | Discharge status: Home / Self Care | Payer: Medicare PPO | Source: Ambulatory Visit | Attending: Internal Medicine | Admitting: Internal Medicine

## 2020-11-26 ENCOUNTER — Other Ambulatory Visit: Payer: Self-pay

## 2020-11-26 DIAGNOSIS — J84112 Idiopathic pulmonary fibrosis: Secondary | ICD-10-CM | POA: Insufficient documentation

## 2020-11-26 DIAGNOSIS — J3489 Other specified disorders of nose and nasal sinuses: Secondary | ICD-10-CM | POA: Diagnosis not present

## 2020-11-26 DIAGNOSIS — R930 Abnormal findings on diagnostic imaging of skull and head, not elsewhere classified: Secondary | ICD-10-CM

## 2020-11-26 DIAGNOSIS — R058 Other specified cough: Secondary | ICD-10-CM | POA: Diagnosis not present

## 2020-11-26 DIAGNOSIS — J984 Other disorders of lung: Secondary | ICD-10-CM | POA: Diagnosis not present

## 2020-11-26 DIAGNOSIS — I2510 Atherosclerotic heart disease of native coronary artery without angina pectoris: Secondary | ICD-10-CM | POA: Diagnosis not present

## 2020-11-26 DIAGNOSIS — J321 Chronic frontal sinusitis: Secondary | ICD-10-CM | POA: Diagnosis not present

## 2020-11-26 DIAGNOSIS — J929 Pleural plaque without asbestos: Secondary | ICD-10-CM | POA: Diagnosis not present

## 2020-11-26 DIAGNOSIS — I712 Thoracic aortic aneurysm, without rupture: Secondary | ICD-10-CM | POA: Diagnosis not present

## 2020-11-26 IMAGING — CT CT MAXILLOFACIAL W/O CM
3 series · 16 of 47 positions shown, 19 images · non-contrast
Comparison: None.

CLINICAL DATA: Sinus drainage for 2 months.  Cough.

EXAM:
CT MAXILLOFACIAL WITHOUT CONTRAST
TECHNIQUE: Multidetector CT images of the paranasal sinuses were obtained using
the standard protocol without intravenous contrast.

[Series 2: max soft · axial · 0.36mm/px · z∈[+160,+302]mm · 10 of 83 slices shown, 13 images]
[im 6/83  brain]
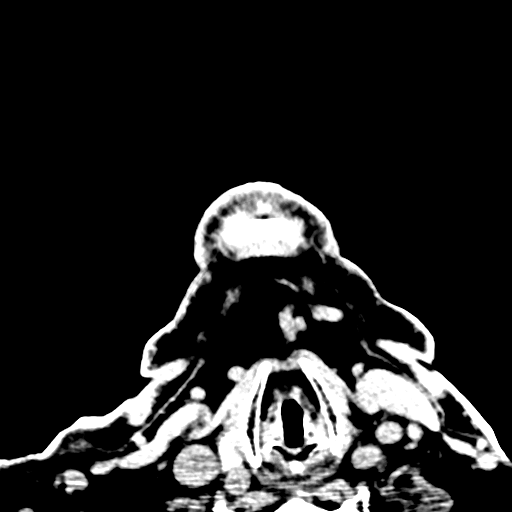
[im 6/83  bone]
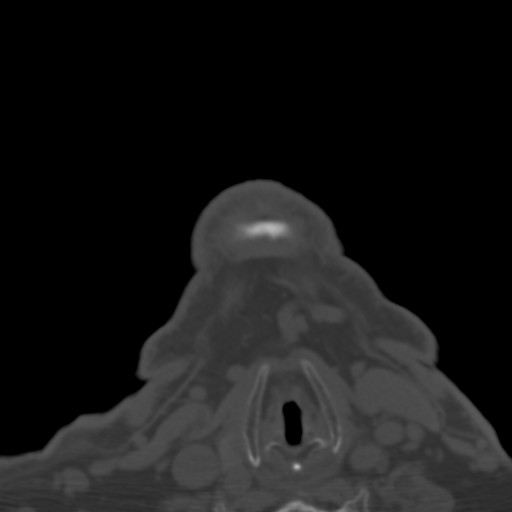
[im 15/83  bone]
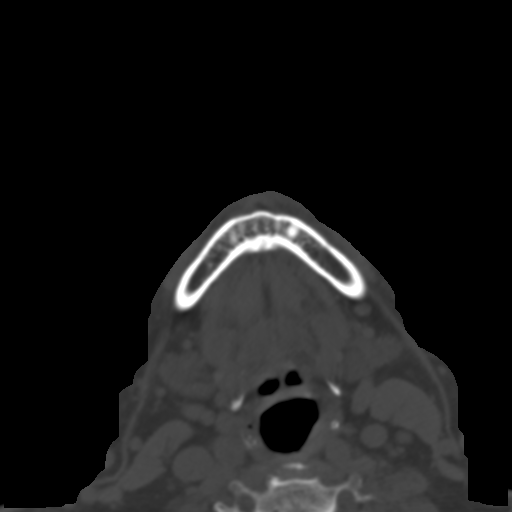
[im 23/83  bone]
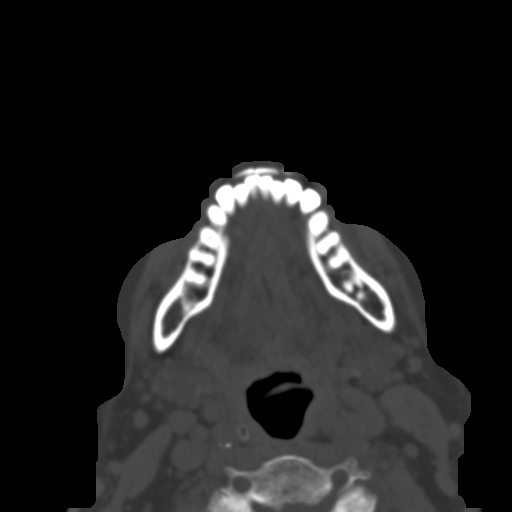
[im 29/83  bone]
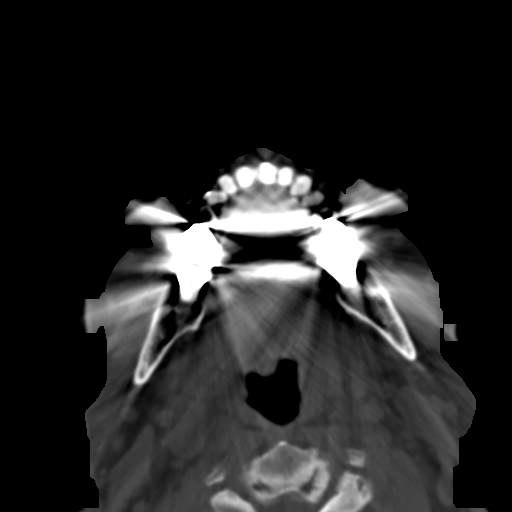
[im 37/83  brain]
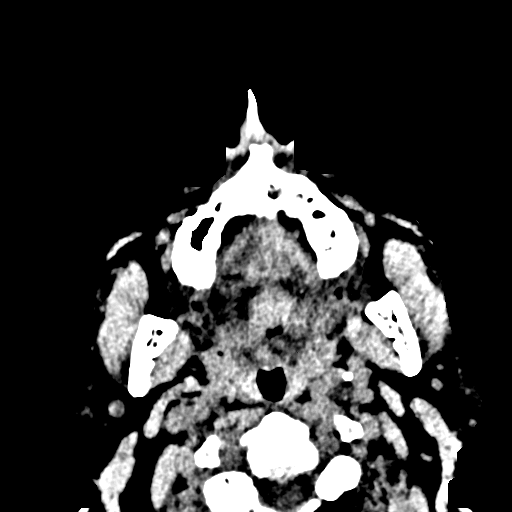
[im 37/83  bone]
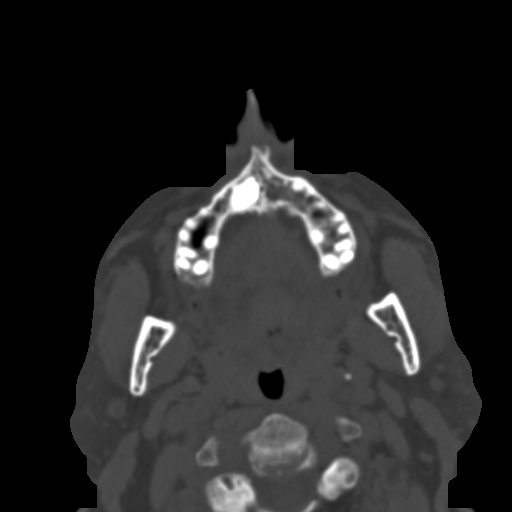
[im 46/83  bone]
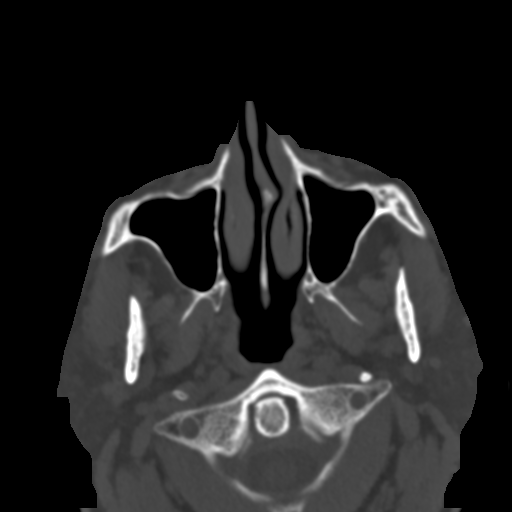
[im 54/83  bone]
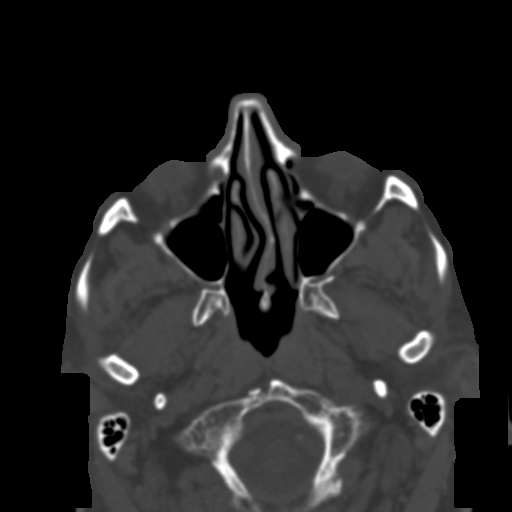
[im 63/83  bone]
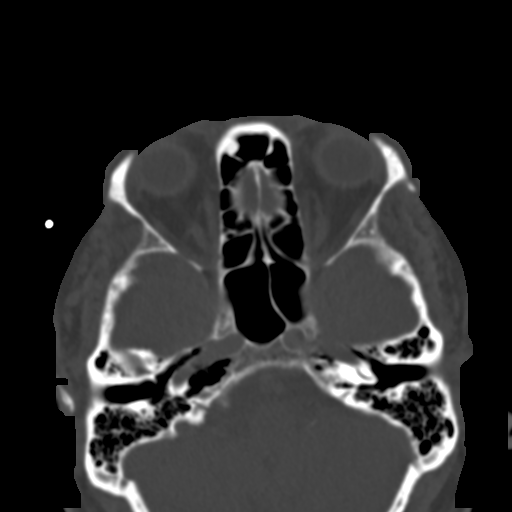
[im 68/83  brain]
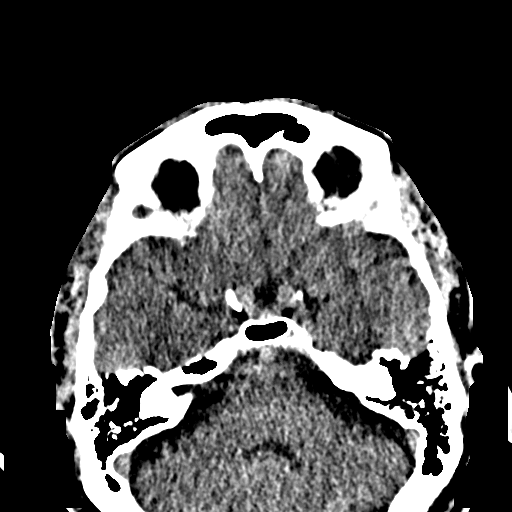
[im 68/83  bone]
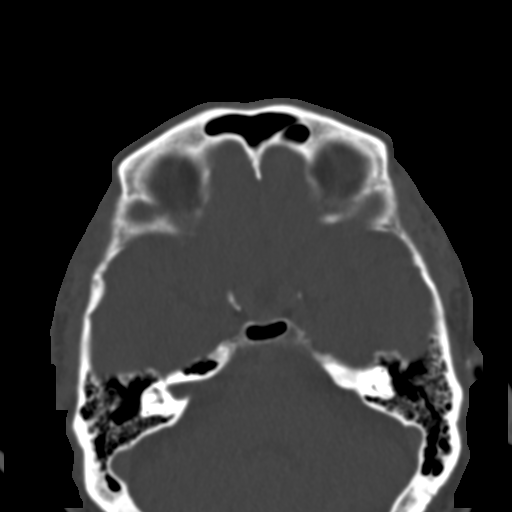
[im 77/83  bone]
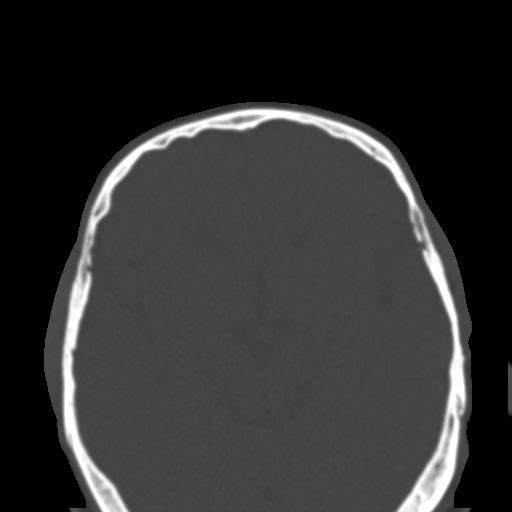

[Series 6: coronal soft · coronal · 0.36mm/px · 3 of 81 slices shown]
[im 27/81  bone]
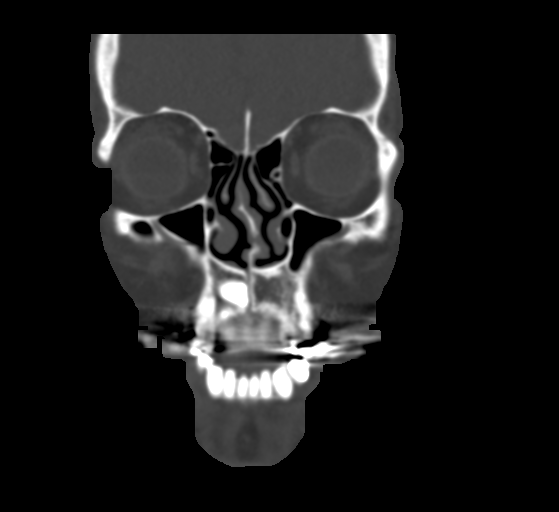
[im 36/81  bone]
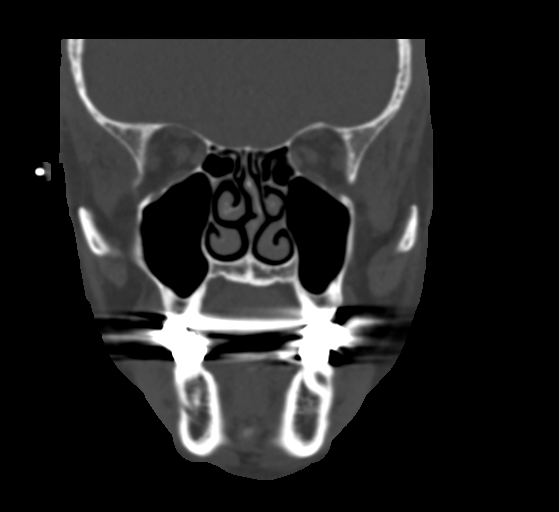
[im 45/81  bone]
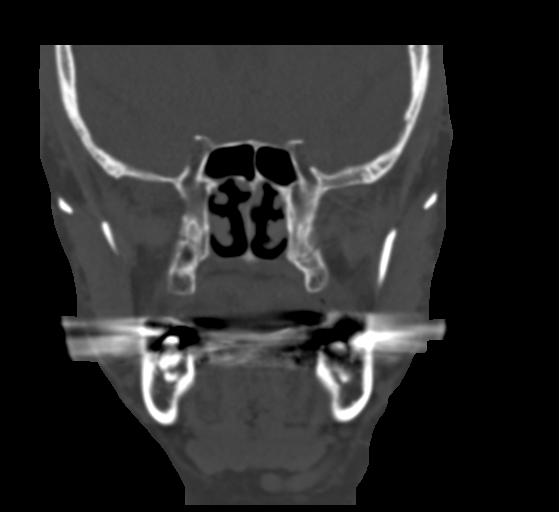

[Series 7: sagittal soft · sagittal · 0.35mm/px · 3 of 91 slices shown]
[im 31/91  bone]
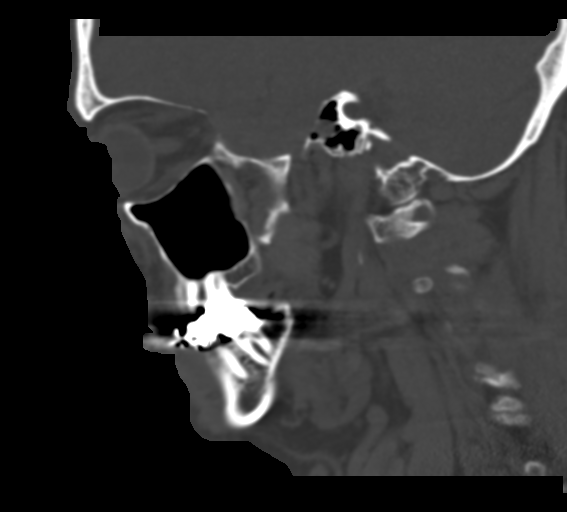
[im 46/91  bone]
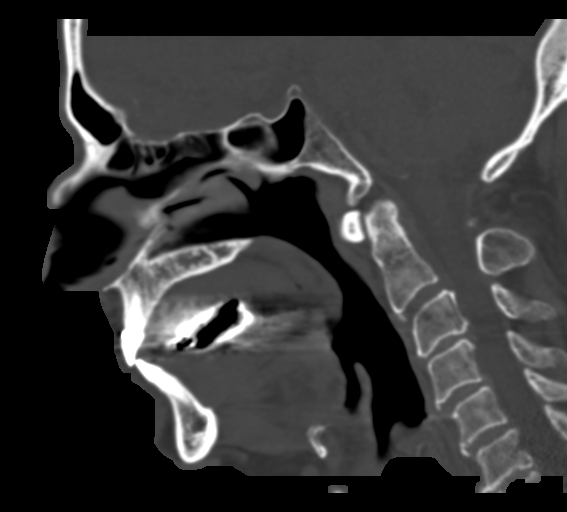
[im 61/91  bone]
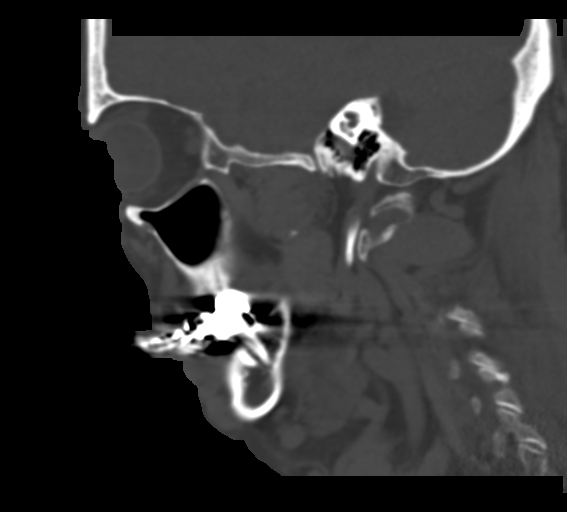

[16 of 47 positions shown; findings below may reference images not displayed]

FINDINGS: Paranasal sinuses:

Frontal: Normally aerated. Patent frontal sinus drainage pathways.

Ethmoid: Normally aerated.

Maxillary: Normally aerated.

Sphenoid: Normally aerated. Patent sphenoethmoidal recesses.

Right ostiomeatal unit: Patent.

Left ostiomeatal unit: Patent.

Nasal passages: Patent. S shaped curvature of the nasal septum
noted. Leftward deviation inferiorly extends 4.5 mm from the midline
without obstruction.

Anatomy: No pneumatization superior to anterior ethmoid notches.
Symmetric and intact olfactory grooves and fovea ethmoidalis, Keros
I (1-3mm). Sellar sphenoid pneumatization pattern. No dehiscence of
carotid or optic canals. No onodi cell.

Other: Orbits and intracranial compartment are unremarkable. Visible
mastoid air cells are normally aerated.
IMPRESSION: 1. Normally aerated paranasal sinuses. Patent sinus drainage
pathways.
2. S shaped curvature of the nasal septum.

## 2020-11-26 IMAGING — CT CT CHEST HIGH RESOLUTION W/O CM
3 of 5 series · 14 of 36 positions shown, 15 images · non-contrast
Comparison: None.

CLINICAL DATA: Abnormal chest radiographs

EXAM:
CT CHEST WITHOUT CONTRAST
TECHNIQUE: Multidetector CT imaging of the chest was performed following the
standard protocol without intravenous contrast. High resolution
imaging of the lungs, as well as inspiratory and expiratory imaging,
was performed.

[Series 3: standard chest · axial · 0.68mm/px · z∈[-107,+109]mm · 8 of 134 slices shown]
[im 13/134  mediastinal]
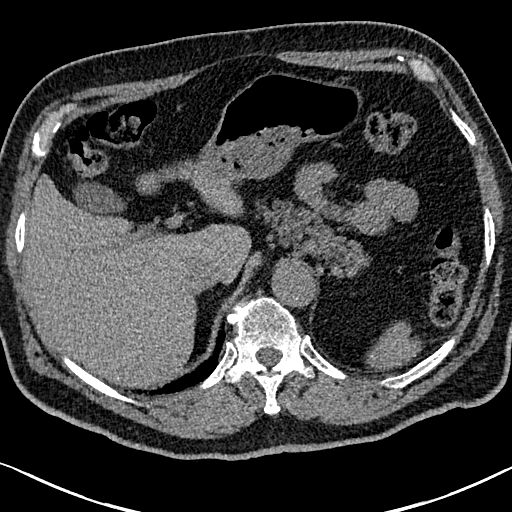
[im 26/134  mediastinal]
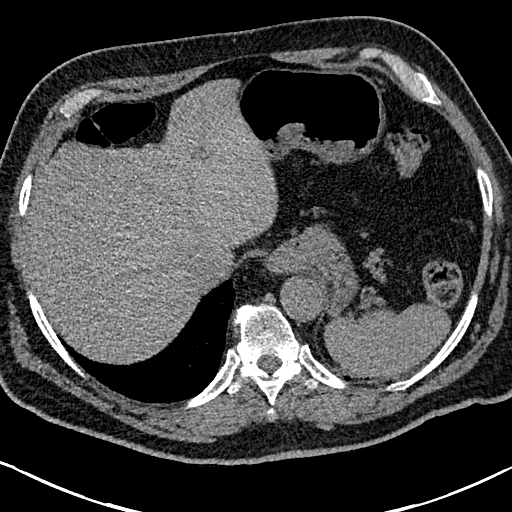
[im 45/134  mediastinal]
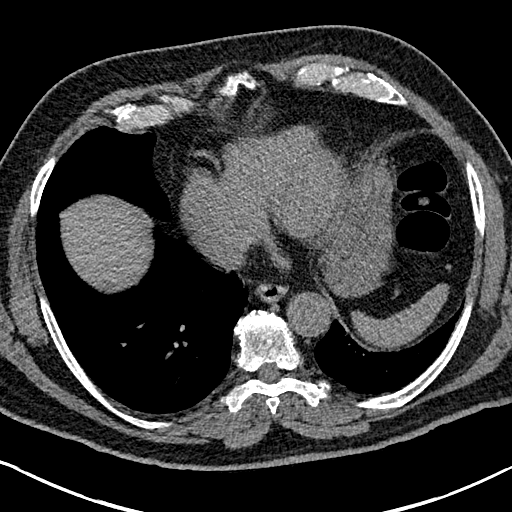
[im 58/134  mediastinal]
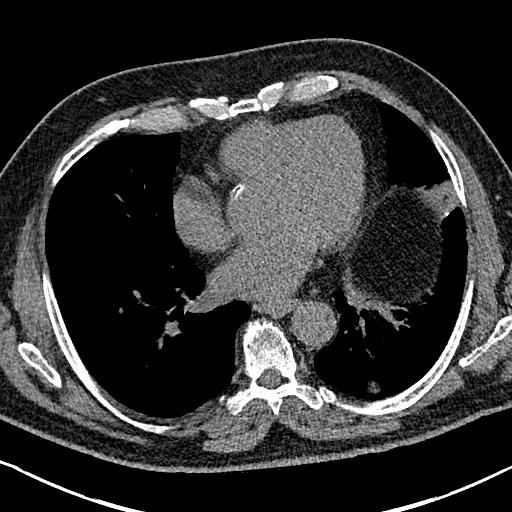
[im 77/134  mediastinal]
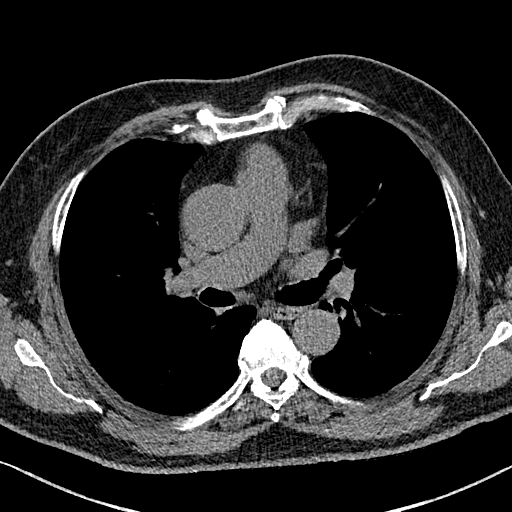
[im 89/134  mediastinal]
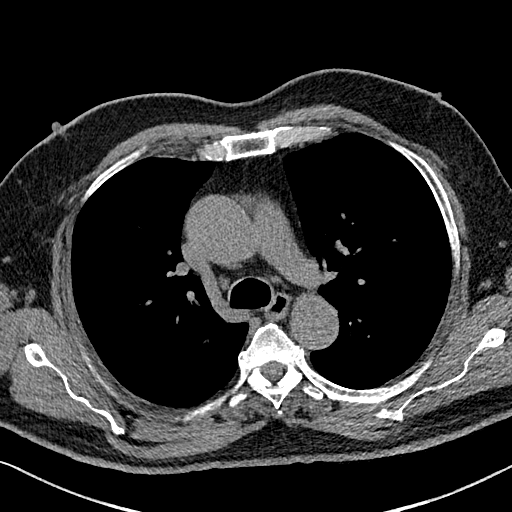
[im 108/134  mediastinal]
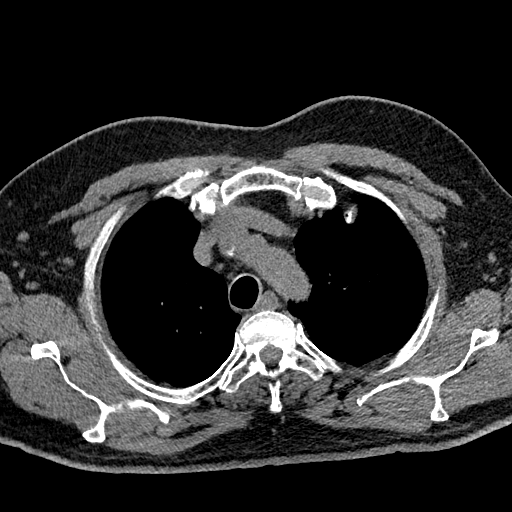
[im 121/134  mediastinal]
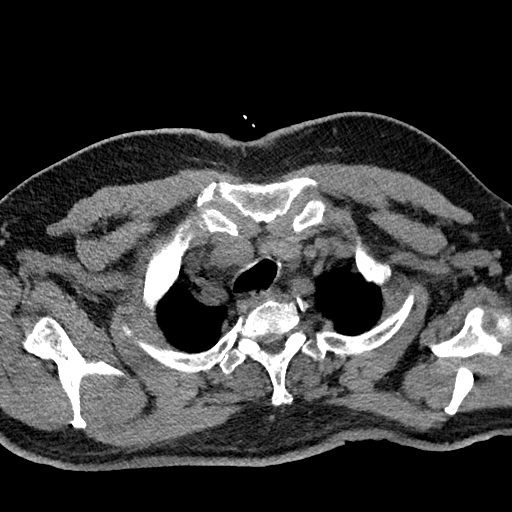

[Series 7: coronal · coronal · 0.55mm/px · 3 of 151 slices shown]
[im 31/151  lung]
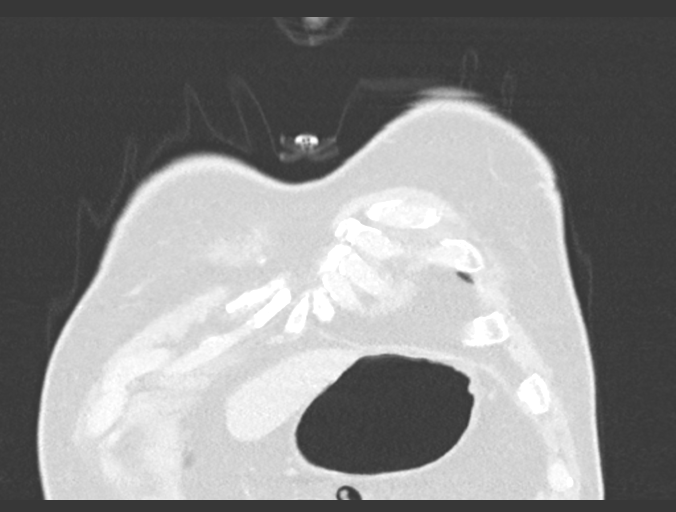
[im 61/151  lung]
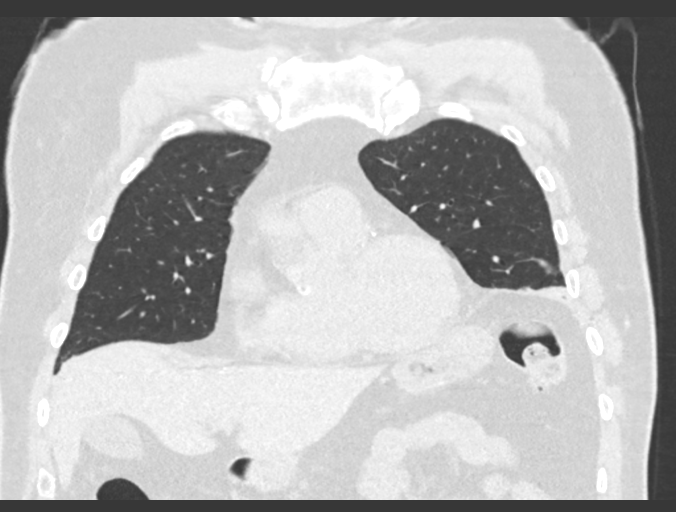
[im 91/151  lung]
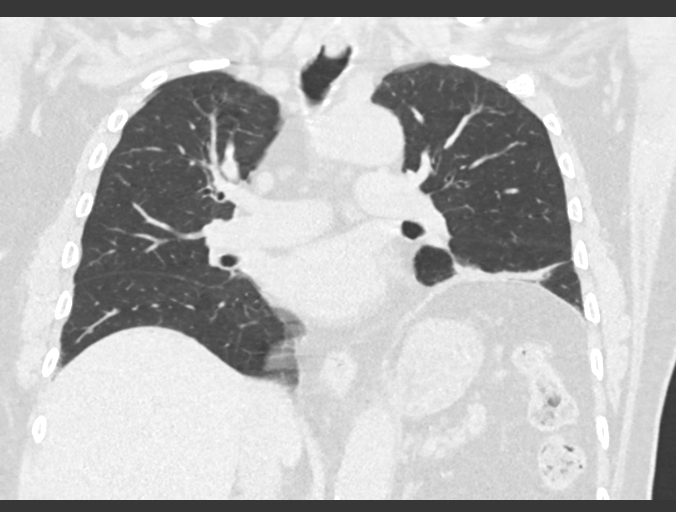

[Series 9: high res insp · axial · 0.69mm/px · z∈[-136,+136]mm · 3 of 20 slices shown, 4 images]
[im 1/20  mediastinal]
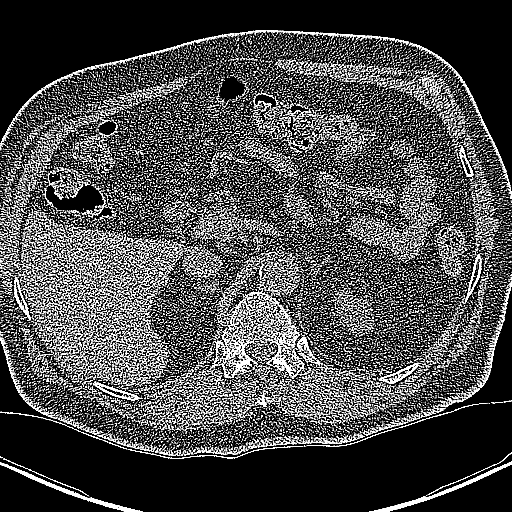
[im 1/20  lung]
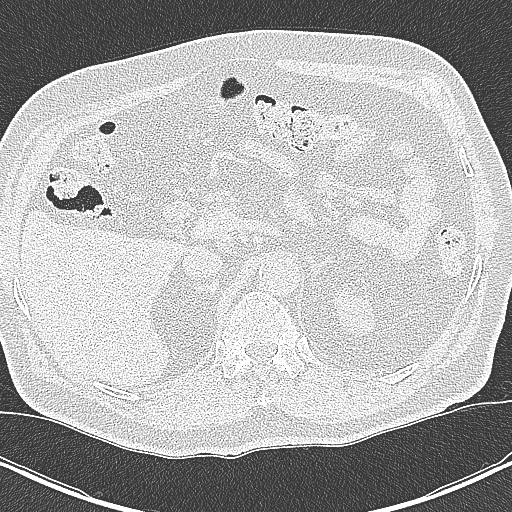
[im 10/20  lung]
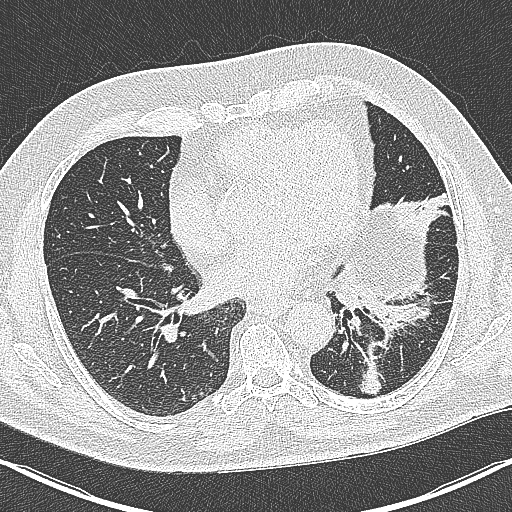
[im 20/20  lung]
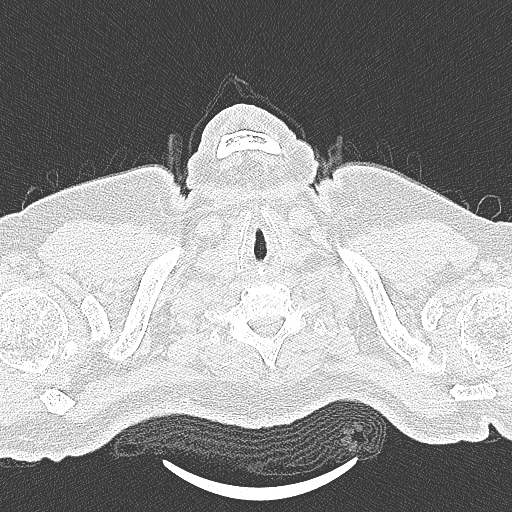

[14 of 36 positions shown; findings below may reference images not displayed]

FINDINGS: Cardiovascular: Enlargement of the tubular ascending thoracic aorta,
measuring up to 4.4 x 4.4 cm. Normal heart size. Left and right
coronary artery calcifications and/or stents. No pericardial
effusion.

Mediastinum/Nodes: No enlarged mediastinal, hilar, or axillary lymph
nodes. Thyroid gland, trachea, and esophagus demonstrate no
significant findings.

Lungs/Pleura: Mild, diffuse bilateral bronchial wall thickening.
Bandlike scarring of the left lung base with associated volume loss
and elevation of the left hemidiaphragm. No significant air trapping
on expiratory phase imaging. No pleural effusion or pneumothorax.

Upper Abdomen: No acute abnormality.

Musculoskeletal: No chest wall mass or suspicious bone lesions
identified.
IMPRESSION: 1. No evidence of fibrotic interstitial lung disease. Bland
appearing, bandlike scarring of the left lung base with associated
volume loss and elevation of the left hemidiaphragm.
2. Mild, diffuse bilateral bronchial wall thickening, consistent
with nonspecific infectious or inflammatory bronchitis.
3. Enlargement of the tubular ascending thoracic aorta, measuring up
to 4.4 x 4.4 cm. Recommend annual imaging followup by CTA or MRA.
This recommendation follows [XP]
ACCF/AHA/AATS/ACR/ASA/SCA/OXENDINE/OXENDINE/OXENDINE/OXENDINE Guidelines for the
Diagnosis and Management of Patients with Thoracic Aortic Disease.
Circulation. [XP]; 121: [id number]. Aortic aneurysm NOS ([XP]-[XP])
4. Coronary artery disease.

## 2020-11-29 ENCOUNTER — Other Ambulatory Visit: Payer: Self-pay

## 2020-11-29 ENCOUNTER — Ambulatory Visit (HOSPITAL_COMMUNITY)
Admission: RE | Admit: 2020-11-29 | Discharge: 2020-11-29 | Disposition: A | Discharge status: Home / Self Care | Payer: Medicare PPO | Source: Ambulatory Visit | Attending: Internal Medicine | Admitting: Internal Medicine

## 2020-11-29 ENCOUNTER — Encounter: Payer: Self-pay | Admitting: Internal Medicine

## 2020-11-29 DIAGNOSIS — R0600 Dyspnea, unspecified: Secondary | ICD-10-CM | POA: Diagnosis not present

## 2020-11-29 DIAGNOSIS — R930 Abnormal findings on diagnostic imaging of skull and head, not elsewhere classified: Secondary | ICD-10-CM | POA: Insufficient documentation

## 2020-11-29 DIAGNOSIS — R0609 Other forms of dyspnea: Secondary | ICD-10-CM

## 2020-11-29 DIAGNOSIS — J986 Disorders of diaphragm: Secondary | ICD-10-CM | POA: Diagnosis not present

## 2020-11-29 DIAGNOSIS — R942 Abnormal results of pulmonary function studies: Secondary | ICD-10-CM | POA: Diagnosis not present
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
## 2020-11-30 ENCOUNTER — Encounter: Payer: Self-pay | Admitting: Internal Medicine

## 2020-11-30 DIAGNOSIS — I712 Thoracic aortic aneurysm, without rupture, unspecified: Secondary | ICD-10-CM | POA: Insufficient documentation

## 2020-11-30 NOTE — Progress Notes (Signed)
Tried calling the pt and there was no answer and no option to leave msg, will call back.

## 2020-11-30 NOTE — Progress Notes (Signed)
Tried calling the pt and there was no answer and no option to leave msg, WCB.  

## 2020-12-06 ENCOUNTER — Other Ambulatory Visit: Payer: Self-pay

## 2020-12-06 ENCOUNTER — Ambulatory Visit: Payer: Medicare PPO | Admitting: Internal Medicine

## 2020-12-06 ENCOUNTER — Encounter: Payer: Self-pay | Admitting: Internal Medicine

## 2020-12-06 DIAGNOSIS — J986 Disorders of diaphragm: Secondary | ICD-10-CM

## 2020-12-06 DIAGNOSIS — R058 Other specified cough: Secondary | ICD-10-CM | POA: Diagnosis not present

## 2020-12-06 DIAGNOSIS — I712 Thoracic aortic aneurysm, without rupture, unspecified: Secondary | ICD-10-CM

## 2020-12-06 DIAGNOSIS — R9389 Abnormal findings on diagnostic imaging of other specified body structures: Secondary | ICD-10-CM

## 2020-12-06 NOTE — Assessment & Plan Note (Signed)
Onset early spring 2022 -  Allergy profile 10/25/2020 >  Eos 0.1 /  IgE  66 -  Sinus CT  11/26/20 > slt deviated septum s obstruction  -  Better 12/06/2020   rec zyrtec and f/u prn

## 2020-12-06 NOTE — Assessment & Plan Note (Signed)
Ct 11/26/20 x 4.4 cm > referred to t surgery 12/06/2020   Discussed pathophysiology and need to keep bp down pending T surgery f/u

## 2020-12-06 NOTE — Assessment & Plan Note (Signed)
Sniff test POS  11/29/2020   Advised on likely etiology (viral) and need to control wt to prevent any extra abdominal loading given limited ventilatory reserve.    Discussed also option of plication if gets worse doe though not interested in surgical opinion at this point.  Discussed in detail all the  indications, usual  risks and alternatives  relative to the benefits with patient who agrees to proceed with conservative f/u as outlined    F/u can be prn           Each maintenance medication was reviewed in detail including emphasizing most importantly the difference between maintenance and prns and under what circumstances the prns are to be triggered using an action plan format where appropriate.  Total time for H and P, chart review, counseling  and generating customized AVS unique to this summary final office visit / same day charting = 35 min

## 2020-12-06 NOTE — Patient Instructions (Addendum)
Ok to add zyrtec 10 mg at bedtime if needed for night time drainage or morning   You have a paralzyed L diaphragm - best treatment is regular exercise and keep your weight   Pulmonary follow up is as needed

## 2020-12-06 NOTE — Progress Notes (Signed)
Adam Sherman, male    DOB: 1946/03/18,   MRN: [id number]   Brief patient profile:  27 yowm quit smoking 1972 s sequelae but about 2005 dx with asthma in [place] and rx with advair but lung function was nl off it 03/08/16 @ [place] eval > rec elevate HOB and stop the advair and did fine until early spring 2022 with new c/o  am congestion /urge to clear the throat so self referred to pulmonary clinic 10/25/2020    History of Present Illness  10/25/2020  Pulmonary/ 1st office eval/ Adam Sherman / [place] [facility]  Chief Complaint  Patient presents with   Pulmonary Consult    Self referral- c/o waking up with congestion and cough every am for the past several months. Cough is prod with clear sputum. He also c/o DOE if he walks fast.   Dyspnea:  Really Not limited by breathing from desired activities   Cough: not aware of it at night /  Worse about an hour each am until he clears out sev tsp of white mucus assoc with subj wheeze  Sleep: 4 in bed blocks / prilosec 20 mg q d  SABA use:  Not sure it helps at all Imp; Upper airway cough syndrome Onset early spring 2022 -  Allergy profile 10/25/2020 >  Eos 0.1/  IgE  66 -  Sinus CT  11/26/20 > slt deviated septum s obstruction  -  HRCT chest 11/26/20 min scarring no ILD  Rec GERD rx    12/06/2020  f/u ov/[place] office/Adam Sherman re: all symptoms better to his satisfaction Chief Complaint  Patient presents with   Follow-up    Patient states that cough/breathing is better since last visit.  Dyspnea:  Not limited by breathing from desired activities   Cough: much better minimal am  Sleeping: fine x sliding down on 6.5 inches hob and tol 4 in better  SABA use: none 02: none  Covid status: x 3  Lung cancer screening: n/a    No obvious day to day or daytime variability or assoc excess/ purulent sputum or mucus plugs or hemoptysis or cp or chest tightness, subjective wheeze or overt sinus or hb symptoms.   Sleeping ok  without nocturnal  or early am  exacerbation  of respiratory  c/o's or need for noct saba. Also denies any obvious fluctuation of symptoms with weather or environmental changes or other aggravating or alleviating factors except as outlined above.  No unusual exposure hx or h/o childhood pna/ asthma or knowledge of premature birth.  Current Allergies, Complete Past Medical History, Past Surgical History, Family History, and Social History were reviewed in [employer] record.  ROS  The following are not active complaints unless bolded Hoarseness, sore throat, dysphagia, dental problems, itching, sneezing,  nasal congestion or discharge of excess mucus or purulent secretions, ear ache,   fever, chills, sweats, unintended wt loss or wt gain, classically pleuritic or exertional cp,  orthopnea pnd or arm/hand swelling  or leg swelling, presyncope, palpitations, abdominal pain, anorexia, nausea, vomiting, diarrhea  or change in bowel habits or change in bladder habits, change in stools or change in urine, dysuria, hematuria,  rash, arthralgias, visual complaints, headache, numbness, weakness or ataxia or problems with walking or coordination,  change in mood or  memory.        Current Meds  Medication Sig   ALBUTEROL IN Inhale 2 puffs into the lungs every 4 (four) hours as needed.   amLODipine (NORVASC) 5  MG tablet Take 5 mg by mouth daily.   famotidine (PEPCID) 20 MG tablet One at bedtime   losartan-hydrochlorothiazide (HYZAAR) 100-25 MG per tablet Take 1 tablet by mouth daily.   omeprazole (PRILOSEC) 40 MG capsule Take 30-60 min before first meal of the day                     Past Medical History:  Diagnosis Date   Arthritis    thumb   Asthma    Cataract    GERD (gastroesophageal reflux disease)    History of kidney stones    Hypertension    Squamous cell carcinoma of skin 08/05/2018   right dorsum of hand       Objective:     Wt Readings from Last 3 Encounters:  12/06/20 196 lb (88.9  kg)  10/25/20 196 lb (88.9 kg)  03/19/20 195 lb (88.5 kg)      Vital signs reviewed  12/06/2020  - Note at rest 02 sats  96% on RA   General appearance:    pleasant amb wm nad   HEENT : pt wearing mask not removed for exam due to covid -19 concerns.    NECK :  without JVD/Nodes/TM/ nl carotid upstrokes bilaterally   LUNGS: no acc muscle use,  Nl contour chest with decreased bs L base without cough on insp or exp maneuvers   CV:  RRR  no s3 or murmur or increase in P2, and no edema   ABD:  soft and nontender with nl inspiratory excursion in the supine position. No bruits or organomegaly appreciated, bowel sounds nl  MS:  Nl gait/ ext warm without deformities, calf tenderness, cyanosis or clubbing No obvious joint restrictions   SKIN: warm and dry without lesions    NEURO:  alert, approp, nl sensorium with  no motor or cerebellar deficits apparent.               Assessment

## 2020-12-06 NOTE — Assessment & Plan Note (Signed)
See cxr 10/25/2020   - Sinus CT 10/26/2020 >>> nl  -  HRCT  11/26/20 > no ILD , minimal scarring   No additonal w/u needed

## 2020-12-20 ENCOUNTER — Telehealth: Payer: Self-pay | Admitting: Internal Medicine

## 2020-12-20 NOTE — Telephone Encounter (Signed)
Call returned to patient, confirmed DOB. Patient wife states she would like to switch physicians. The current physician he is scheduled with [facility] is not who they prefer. They prefer a Dr Cyndia Bent.  Call made to MD office Cindy who handles new patients is not in the office today but will be back tomorrow. Made aware on voicemail patient would like to switch providers to Dr Cyndia Bent.   Call returned to patient to make aware they need to call the office tomorrow at [phone] to reschedule with the desired provider. Voiced understanding.   Nothing further needed at this time.

## 2021-01-13 ENCOUNTER — Encounter: Payer: Medicare PPO | Admitting: Cardiothoracic Surgery

## 2021-02-02 ENCOUNTER — Other Ambulatory Visit: Payer: Self-pay

## 2021-02-02 ENCOUNTER — Institutional Professional Consult (permissible substitution): Payer: Medicare PPO | Admitting: Surgery

## 2021-02-02 ENCOUNTER — Encounter: Payer: Self-pay | Admitting: Surgery

## 2021-02-02 VITALS — BP 137/84 | HR 76 | Resp 20 | Ht 68.0 in | Wt 195.0 lb

## 2021-02-02 DIAGNOSIS — I712 Thoracic aortic aneurysm, without rupture: Secondary | ICD-10-CM | POA: Diagnosis not present

## 2021-02-02 DIAGNOSIS — I7121 Aneurysm of the ascending aorta, without rupture: Secondary | ICD-10-CM

## 2021-02-04 ENCOUNTER — Encounter: Payer: Self-pay | Admitting: Surgery

## 2021-02-04 NOTE — Progress Notes (Signed)
Cardiothoracic Surgery Consultation  PCP is Deland Pretty, MD Referring Provider is Tanda Rockers, MD  Chief Complaint  Patient presents with   Thoracic Aortic Aneurysm    Surgical consult, Chest CT 11/26/20    HPI:  The patient is a 75 year old gentleman with a history of hypertension, GERD, and asthma who was evaluated by Dr. Melvyn Novas in June for complaints of cough and congestion when he woke up every morning that have been going on for several months.  He had a high-resolution chest CT on 11/26/2020 which showed minimal scarring with no interstitial lung disease.  It also showed an incidental 4.4 cm fusiform ascending aortic aneurysm with some coronary artery calcification.  The patient has no prior history of aortic aneurysm.  He does report that his father had myocardial infarction in the past and then died after having chest pain and the family was told that his aorta had ruptured.  It sounds like he may have had an aortic dissection.  There is no family history of aortic valve disease or connective tissue disorder.  The patient denies any chest pressure or pain.  He has had no back pain.  Denies shortness of breath.  Past Medical History:  Diagnosis Date   Arthritis    thumb   Asthma    Cataract    GERD (gastroesophageal reflux disease)    History of kidney stones    Hypertension    Squamous cell carcinoma of skin 08/05/2018   right dorsum of hand    Past Surgical History:  Procedure Laterality Date   BACK SURGERY  2012   ruptured disc   CARPAL TUNNEL RELEASE     right hand 2006   CARPAL TUNNEL RELEASE Left 2021   COLONOSCOPY  06/2016   LUMBAR LAMINECTOMY/DECOMPRESSION MICRODISCECTOMY Left 01/27/2020   Procedure: Left Lumbar Three-Four  Microdiscectomy;  Surgeon: Erline Levine, MD;  Location: [facility];  Service: Neurosurgery;  Laterality: Left;   POLYPECTOMY      Family History  Problem Relation Age of Onset   Colon cancer Neg Hx    Stomach cancer Neg Hx    Colon  polyps Neg Hx    Esophageal cancer Neg Hx    Rectal cancer Neg Hx     Social History Social History   Tobacco Use   Smoking status: Former    Packs/day: 0.10    Years: 3.00    Pack years: 0.30    Types: Cigarettes    Quit date: 06/12/1970    Years since quitting: 50.6   Smokeless tobacco: Never  Vaping Use   Vaping Use: Never used  Substance Use Topics   Alcohol use: No    Alcohol/week: 0.0 standard drinks   Drug use: No    Current Outpatient Medications  Medication Sig Dispense Refill   amLODipine (NORVASC) 5 MG tablet Take 5 mg by mouth daily.     famotidine (PEPCID) 20 MG tablet One at bedtime 30 tablet 11   losartan-hydrochlorothiazide (HYZAAR) 100-25 MG per tablet Take 1 tablet by mouth daily.     omeprazole (PRILOSEC) 40 MG capsule Take 30-60 min before first meal of the day 30 capsule 2   No current facility-administered medications for this visit.    No Known Allergies  Review of Systems  Constitutional: Negative.  Negative for fatigue.  HENT: Negative.    Eyes: Negative.   Respiratory:  Positive for cough.   Cardiovascular:  Negative for chest pain and leg swelling.  Leg cramps  Gastrointestinal: Negative.   Endocrine: Negative.   Genitourinary: Negative.   Musculoskeletal: Negative.   Skin: Negative.   Allergic/Immunologic: Negative.   Neurological: Negative.   Hematological: Negative.   Psychiatric/Behavioral: Negative.     BP 137/84   Pulse 76   Resp 20   Ht '5\' 8"'$  (1.727 m)   Wt 195 lb (88.5 kg)   SpO2 95% Comment: RA  BMI 29.65 kg/m  Physical Exam Constitutional:      Appearance: Normal appearance.  HENT:     Head: Normocephalic and atraumatic.  Eyes:     Extraocular Movements: Extraocular movements intact.     Pupils: Pupils are equal, round, and reactive to light.  Neck:     Vascular: No carotid bruit.  Cardiovascular:     Rate and Rhythm: Normal rate and regular rhythm.     Heart sounds: Normal heart sounds. No murmur  heard. Pulmonary:     Effort: Pulmonary effort is normal.     Breath sounds: Normal breath sounds.  Musculoskeletal:     Cervical back: Normal range of motion.  Skin:    General: Skin is warm and dry.  Neurological:     General: No focal deficit present.     Mental Status: He is alert and oriented to person, place, and time.  Psychiatric:        Mood and Affect: Mood normal.        Behavior: Behavior normal.     Diagnostic Tests:  Narrative & Impression  CLINICAL DATA:  Abnormal chest radiographs   EXAM: CT CHEST WITHOUT CONTRAST   TECHNIQUE: Multidetector CT imaging of the chest was performed following the standard protocol without intravenous contrast. High resolution imaging of the lungs, as well as inspiratory and expiratory imaging, was performed.   COMPARISON:  None.   FINDINGS: Cardiovascular: Enlargement of the tubular ascending thoracic aorta, measuring up to 4.4 x 4.4 cm. Normal heart size. Left and right coronary artery calcifications and/or stents. No pericardial effusion.   Mediastinum/Nodes: No enlarged mediastinal, hilar, or axillary lymph nodes. Thyroid gland, trachea, and esophagus demonstrate no significant findings.   Lungs/Pleura: Mild, diffuse bilateral bronchial wall thickening. Bandlike scarring of the left lung base with associated volume loss and elevation of the left hemidiaphragm. No significant air trapping on expiratory phase imaging. No pleural effusion or pneumothorax.   Upper Abdomen: No acute abnormality.   Musculoskeletal: No chest wall mass or suspicious bone lesions identified.   IMPRESSION: 1. No evidence of fibrotic interstitial lung disease. Bland appearing, bandlike scarring of the left lung base with associated volume loss and elevation of the left hemidiaphragm. 2. Mild, diffuse bilateral bronchial wall thickening, consistent with nonspecific infectious or inflammatory bronchitis. 3. Enlargement of the tubular  ascending thoracic aorta, measuring up to 4.4 x 4.4 cm. Recommend annual imaging followup by CTA or MRA. This recommendation follows 2010 ACCF/AHA/AATS/ACR/ASA/SCA/SCAI/SIR/STS/SVM Guidelines for the Diagnosis and Management of Patients with Thoracic Aortic Disease. Circulation. 2010; [id number]. Aortic aneurysm NOS (ICD10-I71.9) 4. Coronary artery disease.     Electronically Signed   By: Eddie Candle M.D.   On: 11/29/2020 10:47   Impression:  This 75 year old gentleman has an incidental 4.4 cm fusiform ascending aortic aneurysm of unknown duration.  He does have a family history of possible aortic dissection or aneurysm rupture in his father. There is no history of aortic valve disease and the patient does not have a murmur.  His aneurysm is small and well below the surgical  threshold of 5.5 cm.  I reviewed the CT images with him and his wife and answered their questions.  I stressed the importance of continued good blood pressure control in preventing further enlargement and acute aortic dissection.  I advised him against doing any heavy lifting that may require a Valsalva maneuver and also advised him against taking quinolone antibiotics that have been associated with enlargement of aortic aneurysms.  Plan:  I will plan to see him back in 1 year with a CTA of the chest for follow-up.  I spent 40 minutes performing this consultation and > 50% of this time was spent face to face counseling and coordinating the care of this patient's ascending aortic aneurysm.  Gaye Pollack, MD [facility] [phone]

## 2021-03-21 DIAGNOSIS — H903 Sensorineural hearing loss, bilateral: Secondary | ICD-10-CM | POA: Diagnosis not present

## 2021-04-08 DIAGNOSIS — E789 Disorder of lipoprotein metabolism, unspecified: Secondary | ICD-10-CM | POA: Diagnosis not present

## 2021-04-08 DIAGNOSIS — Z125 Encounter for screening for malignant neoplasm of prostate: Secondary | ICD-10-CM | POA: Diagnosis not present

## 2021-04-08 DIAGNOSIS — Z0000 Encounter for general adult medical examination without abnormal findings: Secondary | ICD-10-CM | POA: Diagnosis not present

## 2021-04-14 ENCOUNTER — Ambulatory Visit: Payer: Medicare PPO

## 2021-04-15 DIAGNOSIS — Z0000 Encounter for general adult medical examination without abnormal findings: Secondary | ICD-10-CM | POA: Diagnosis not present

## 2021-04-15 DIAGNOSIS — I10 Essential (primary) hypertension: Secondary | ICD-10-CM | POA: Diagnosis not present

## 2021-04-15 DIAGNOSIS — K219 Gastro-esophageal reflux disease without esophagitis: Secondary | ICD-10-CM | POA: Diagnosis not present

## 2021-05-12 ENCOUNTER — Ambulatory Visit: Payer: Medicare PPO

## 2021-06-14 ENCOUNTER — Telehealth: Payer: Self-pay

## 2021-06-14 NOTE — Telephone Encounter (Signed)
Patient and spouse called [employer] regarding PDT treatments. They state a PA is needed from providers office. Called [employer] this morning and provided CPT code [phone] and [phone] with no authorization or referral needed.   Call REF # [id number]

## 2021-06-20 ENCOUNTER — Ambulatory Visit: Payer: Medicare PPO

## 2021-06-22 ENCOUNTER — Ambulatory Visit: Payer: Medicare PPO

## 2021-06-22 ENCOUNTER — Other Ambulatory Visit: Payer: Self-pay

## 2021-06-22 DIAGNOSIS — L570 Actinic keratosis: Secondary | ICD-10-CM | POA: Diagnosis not present

## 2021-06-22 MED ORDER — AMINOLEVULINIC ACID HCL 20 % EX SOLR
1.0000 "application " | Freq: Once | CUTANEOUS | Status: AC
  Administered 2021-06-22: 354 mg via TOPICAL

## 2021-06-22 NOTE — Progress Notes (Signed)
Patient completed PDT therapy today.  1. AK (actinic keratosis) Scalp  Photodynamic therapy - Scalp Procedure discussed: discussed risks, benefits, side effects. and alternatives   Prep: site scrubbed/prepped with acetone   Location:  Scalp Number of lesions:  Multiple Type of treatment:  Blue light Aminolevulinic Acid (see MAR for details): Levulan Number of Levulan sticks used:  1 Incubation time (minutes):  120 Number of minutes under lamp:  16 Number of seconds under lamp:  40 Cooling:  Floor fan Outcome: patient tolerated procedure well with no complications   Post-procedure details: sunscreen applied    Aminolevulinic Acid HCl 20 % SOLR 354 mg - Scalp   Patient given samples of Solbar Sunscreen and Vanicream moisturizer and shampoo.

## 2021-06-22 NOTE — Patient Instructions (Signed)

## 2021-06-23 ENCOUNTER — Ambulatory Visit: Payer: Medicare PPO | Admitting: Dermatology

## 2021-07-25 ENCOUNTER — Ambulatory Visit: Payer: Medicare PPO

## 2021-07-27 ENCOUNTER — Other Ambulatory Visit: Payer: Self-pay

## 2021-07-27 ENCOUNTER — Ambulatory Visit: Payer: Medicare PPO | Admitting: Dermatology

## 2021-07-27 DIAGNOSIS — L570 Actinic keratosis: Secondary | ICD-10-CM

## 2021-07-27 MED ORDER — AMINOLEVULINIC ACID HCL 20 % EX SOLR
1.0000 "application " | Freq: Once | CUTANEOUS | Status: AC
  Administered 2021-07-27: 354 mg via TOPICAL

## 2021-07-27 NOTE — Patient Instructions (Signed)

## 2021-07-27 NOTE — Progress Notes (Signed)
Patient completed PDT therapy today.  1. AK (actinic keratosis) Head - Anterior (Face)  Photodynamic therapy - Head - Anterior (Face) Procedure discussed: discussed risks, benefits, side effects. and alternatives   Prep: site scrubbed/prepped with acetone   Location:  Face Number of lesions:  Multiple Type of treatment:  Blue light Aminolevulinic Acid (see MAR for details): Levulan Number of Levulan sticks used:  1 Incubation time (minutes):  60 Number of minutes under lamp:  16 Number of seconds under lamp:  40 Cooling:  Floor fan Outcome: patient tolerated procedure well with no complications   Post-procedure details: sunscreen applied    Aminolevulinic Acid HCl 20 % SOLR 354 mg - Head - Anterior (Face)   Patient provided samples of Solbar Sunscreen and Vanicream face wash and moisturizer.   Documentation: I have reviewed the above documentation for accuracy and completeness, and I agree with the above.  Sarina Ser, MD

## 2021-07-30 ENCOUNTER — Encounter: Payer: Self-pay | Admitting: Dermatology

## 2021-08-15 ENCOUNTER — Ambulatory Visit: Payer: Medicare PPO | Admitting: Dermatology

## 2021-08-22 ENCOUNTER — Ambulatory Visit: Payer: Medicare PPO | Admitting: Dermatology

## 2021-09-05 DIAGNOSIS — H25813 Combined forms of age-related cataract, bilateral: Secondary | ICD-10-CM | POA: Diagnosis not present

## 2021-09-05 DIAGNOSIS — H524 Presbyopia: Secondary | ICD-10-CM | POA: Diagnosis not present

## 2021-09-05 DIAGNOSIS — H5203 Hypermetropia, bilateral: Secondary | ICD-10-CM | POA: Diagnosis not present

## 2021-09-26 ENCOUNTER — Ambulatory Visit: Payer: Medicare PPO | Admitting: Dermatology

## 2021-09-26 DIAGNOSIS — Z85828 Personal history of other malignant neoplasm of skin: Secondary | ICD-10-CM

## 2021-09-26 DIAGNOSIS — L821 Other seborrheic keratosis: Secondary | ICD-10-CM | POA: Diagnosis not present

## 2021-09-26 DIAGNOSIS — D1800 Hemangioma unspecified site: Secondary | ICD-10-CM

## 2021-09-26 DIAGNOSIS — Z1283 Encounter for screening for malignant neoplasm of skin: Secondary | ICD-10-CM

## 2021-09-26 DIAGNOSIS — L578 Other skin changes due to chronic exposure to nonionizing radiation: Secondary | ICD-10-CM

## 2021-09-26 DIAGNOSIS — L570 Actinic keratosis: Secondary | ICD-10-CM

## 2021-09-26 DIAGNOSIS — L820 Inflamed seborrheic keratosis: Secondary | ICD-10-CM | POA: Diagnosis not present

## 2021-09-26 DIAGNOSIS — D229 Melanocytic nevi, unspecified: Secondary | ICD-10-CM | POA: Diagnosis not present

## 2021-09-26 DIAGNOSIS — L814 Other melanin hyperpigmentation: Secondary | ICD-10-CM | POA: Diagnosis not present

## 2021-09-26 NOTE — Progress Notes (Signed)
? ?Follow-Up Visit ?  ?Subjective  ?Adam Sherman is a 76 y.o. male who presents for the following: Follow-up (7 month follow up. Hx of aks scalp face and ears trx with pdt to scalp and face. Hx of isk at hands and arms. Hx of scc at right dorsum hands. ). ?The patient presents for Total-Body Skin Exam (TBSE) for skin cancer screening and mole check.  The patient has spots, moles and lesions to be evaluated, some may be new or changing and the patient has concerns that these could be cancer. ? ?The following portions of the chart were reviewed this encounter and updated as appropriate:  Tobacco  Allergies  Meds  Problems  Med Hx  Surg Hx  Fam Hx   ?  ?Review of Systems: No other skin or systemic complaints except as noted in HPI or Assessment and Plan. ? ?Objective  ?Well appearing patient in no apparent distress; mood and affect are within normal limits. ? ?A full examination was performed including scalp, head, eyes, ears, nose, lips, neck, chest, axillae, abdomen, back, buttocks, bilateral upper extremities, bilateral lower extremities, hands, feet, fingers, toes, fingernails, and toenails. All findings within normal limits unless otherwise noted below. ? ?face, ears, and scalp x 16 (16) ?Erythematous thin papules/macules with gritty scale.  ? ?b/l arms and hands x 15 (15) ?Erythematous stuck-on, waxy papule or plaque ? ? ?Assessment & Plan  ?Actinic keratosis (16) ?face, ears, and scalp x 16 ? ?Actinic keratoses are precancerous spots that appear secondary to cumulative UV radiation exposure/sun exposure over time. They are chronic with expected duration over 1 year. A portion of actinic keratoses will progress to squamous cell carcinoma of the skin. It is not possible to reliably predict which spots will progress to skin cancer and so treatment is recommended to prevent development of skin cancer. ?Recommend daily broad spectrum sunscreen SPF 30+ to sun-exposed areas, reapply every 2 hours as needed.   ?Recommend staying in the shade or wearing long sleeves, sun glasses (UVA+UVB protection) and wide brim hats (4-inch brim around the entire circumference of the hat). ?Call for new or changing lesions. ?Destruction of lesion - face, ears, and scalp x 16 ?Complexity: simple   ?Destruction method: cryotherapy   ?Informed consent: discussed and consent obtained   ?Timeout:  patient name, date of birth, surgical site, and procedure verified ?Lesion destroyed using liquid nitrogen: Yes   ?Region frozen until ice ball extended beyond lesion: Yes   ?Outcome: patient tolerated procedure well with no complications   ?Post-procedure details: wound care instructions given   ?Additional details:  Prior to procedure, discussed risks of blister formation, small wound, skin dyspigmentation, or rare scar following cryotherapy. Recommend Vaseline ointment to treated areas while healing. ? ?Inflamed seborrheic keratosis (15) ?b/l arms and hands x 15 ?irritated ?Destruction of lesion - b/l arms and hands x 15 ?Complexity: simple   ?Destruction method: cryotherapy   ?Informed consent: discussed and consent obtained   ?Timeout:  patient name, date of birth, surgical site, and procedure verified ?Lesion destroyed using liquid nitrogen: Yes   ?Region frozen until ice ball extended beyond lesion: Yes   ?Outcome: patient tolerated procedure well with no complications   ?Post-procedure details: wound care instructions given   ?Additional details:  Prior to procedure, discussed risks of blister formation, small wound, skin dyspigmentation, or rare scar following cryotherapy. Recommend Vaseline ointment to treated areas while healing. ? ?Lentigines ?- Scattered tan macules ?- Due to sun exposure ?- Benign-appearing,  observe ?- Recommend daily broad spectrum sunscreen SPF 30+ to sun-exposed areas, reapply every 2 hours as needed. ?- Call for any changes ? ?Seborrheic Keratoses ?- Stuck-on, waxy, tan-brown papules and/or plaques  ?-  Benign-appearing ?- Discussed benign etiology and prognosis. ?- Observe ?- Call for any changes ? ?Melanocytic Nevi ?- Tan-brown and/or pink-flesh-colored symmetric macules and papules ?- Benign appearing on exam today ?- Observation ?- Call clinic for new or changing moles ?- Recommend daily use of broad spectrum spf 30+ sunscreen to sun-exposed areas.  ? ?Hemangiomas ?- Red papules ?- Discussed benign nature ?- Observe ?- Call for any changes ? ?Actinic Damage ?- Chronic condition, secondary to cumulative UV/sun exposure ?- diffuse scaly erythematous macules with underlying dyspigmentation ?- Recommend daily broad spectrum sunscreen SPF 30+ to sun-exposed areas, reapply every 2 hours as needed.  ?- Staying in the shade or wearing long sleeves, sun glasses (UVA+UVB protection) and wide brim hats (4-inch brim around the entire circumference of the hat) are also recommended for sun protection.  ?- Call for new or changing lesions. ? ?History of Basal Cell Carcinoma of the Skin ?- No evidence of recurrence today right dorsum hand  ?- Recommend regular full body skin exams ?- Recommend daily broad spectrum sunscreen SPF 30+ to sun-exposed areas, reapply every 2 hours as needed.  ?- Call if any new or changing lesions are noted between office visits ? ?Skin cancer screening performed today. ?Return in about 6 months (around 03/28/2022) for ak followup. ?I, Ruthell Rummage, CMA, am acting as scribe for Sarina Ser, MD. ?Documentation: I have reviewed the above documentation for accuracy and completeness, and I agree with the above. ? ?Sarina Ser, MD ? ?

## 2021-09-26 NOTE — Patient Instructions (Addendum)
Cryotherapy Aftercare ? ?Wash gently with soap and water everyday.   ?Apply Vaseline and Band-Aid daily until healed.  ? ? ?Actinic keratoses are precancerous spots that appear secondary to cumulative UV radiation exposure/sun exposure over time. They are chronic with expected duration over 1 year. A portion of actinic keratoses will progress to squamous cell carcinoma of the skin. It is not possible to reliably predict which spots will progress to skin cancer and so treatment is recommended to prevent development of skin cancer. ? ?Recommend daily broad spectrum sunscreen SPF 30+ to sun-exposed areas, reapply every 2 hours as needed.  ?Recommend staying in the shade or wearing long sleeves, sun glasses (UVA+UVB protection) and wide brim hats (4-inch brim around the entire circumference of the hat). ?Call for new or changing lesions.  ? ? ?Seborrheic Keratosis ? ?What causes seborrheic keratoses? ?Seborrheic keratoses are harmless, common skin growths that first appear during adult life.  As time goes by, more growths appear.  Some people may develop a large number of them.  Seborrheic keratoses appear on both covered and uncovered body parts.  They are not caused by sunlight.  The tendency to develop seborrheic keratoses can be inherited.  They vary in color from skin-colored to gray, brown, or even black.  They can be either smooth or have a rough, warty surface.   ?Seborrheic keratoses are superficial and look as if they were stuck on the skin.  Under the microscope this type of keratosis looks like layers upon layers of skin.  That is why at times the top layer may seem to fall off, but the rest of the growth remains and re-grows.   ? ?Treatment ?Seborrheic keratoses do not need to be treated, but can easily be removed in the office.  Seborrheic keratoses often cause symptoms when they rub on clothing or jewelry.  Lesions can be in the way of shaving.  If they become inflamed, they can cause itching, soreness,  or burning.  Removal of a seborrheic keratosis can be accomplished by freezing, burning, or surgery. ?If any spot bleeds, scabs, or grows rapidly, please return to have it checked, as these can be an indication of a skin cancer. ? ? ?Melanoma ABCDEs ? ?Melanoma is the most dangerous type of skin cancer, and is the leading cause of death from skin disease.  You are more likely to develop melanoma if you: ?Have light-colored skin, light-colored eyes, or red or blond hair ?Spend a lot of time in the sun ?Tan regularly, either outdoors or in a tanning bed ?Have had blistering sunburns, especially during childhood ?Have a close family member who has had a melanoma ?Have atypical moles or large birthmarks ? ?Early detection of melanoma is key since treatment is typically straightforward and cure rates are extremely high if we catch it early.  ? ?The first sign of melanoma is often a change in a mole or a new dark spot.  The ABCDE system is a way of remembering the signs of melanoma. ? ?A for asymmetry:  The two halves do not match. ?B for border:  The edges of the growth are irregular. ?C for color:  A mixture of colors are present instead of an even brown color. ?D for diameter:  Melanomas are usually (but not always) greater than 74m - the size of a pencil eraser. ?E for evolution:  The spot keeps changing in size, shape, and color. ? ?Please check your skin once per month between visits. You can use  a small mirror in front and a large mirror behind you to keep an eye on the back side or your body.  ? ?If you see any new or changing lesions before your next follow-up, please call to schedule a visit. ? ?Please continue daily skin protection including broad spectrum sunscreen SPF 30+ to sun-exposed areas, reapplying every 2 hours as needed when you're outdoors.  ? ?Staying in the shade or wearing long sleeves, sun glasses (UVA+UVB protection) and wide brim hats (4-inch brim around the entire circumference of the hat) are  also recommended for sun protection.   ? ?If You Need Anything After Your Visit ? ?If you have any questions or concerns for your doctor, please call our main line at [phone] and press option 4 to reach your doctor's medical assistant. If no one answers, please leave a voicemail as directed and we will return your call as soon as possible. Messages left after 4 pm will be answered the following business day.  ? ?You may also send [place] a message via MyChart. We typically respond to MyChart messages within 1-2 business days. ? ?For prescription refills, please ask your pharmacy to contact our office. Our fax number is [phone]. ? ?If you have an urgent issue when the clinic is closed that cannot wait until the next business day, you can page your doctor at the number below.   ? ?Please note that while we do our best to be available for urgent issues outside of office hours, we are not available 24/7.  ? ?If you have an urgent issue and are unable to reach [place], you may choose to seek medical care at your doctor's office, retail clinic, urgent care center, or emergency room. ? ?If you have a medical emergency, please immediately call 911 or go to the emergency department. ? ?Pager Numbers ? ?- Dr. Nehemiah Massed: [phone] ? ?- Dr. Laurence Ferrari: [phone] ? ?- Dr. Nicole Kindred: [phone] ? ?In the event of inclement weather, please call our main line at [phone] for an update on the status of any delays or closures. ? ?Dermatology Medication Tips: ?Please keep the boxes that topical medications come in in order to help keep track of the instructions about where and how to use these. Pharmacies typically print the medication instructions only on the boxes and not directly on the medication tubes.  ? ?If your medication is too expensive, please contact our office at [phone] option 4 or send [place] a message through [facility].  ? ?We are unable to tell what your co-pay for medications will be in advance as this is different  depending on your insurance coverage. However, we may be able to find a substitute medication at lower cost or fill out paperwork to get insurance to cover a needed medication.  ? ?If a prior authorization is required to get your medication covered by your insurance company, please allow [place] 1-2 business days to complete this process. ? ?Drug prices often vary depending on where the prescription is filled and some pharmacies may offer cheaper prices. ? ?The website www.goodrx.com contains coupons for medications through different pharmacies. The prices here do not account for what the cost may be with help from insurance (it may be cheaper with your insurance), but the website can give you the price if you did not use any insurance.  ?- You can print the associated coupon and take it with your prescription to the pharmacy.  ?- You may also stop by our office during regular business  hours and pick up a GoodRx coupon card.  ?- If you need your prescription sent electronically to a different pharmacy, notify our office through [facility] or by phone at [phone] option 4. ? ? ? ? ?Si Usted Necesita Algo Despu?s de Su Visita ? ?Tambi?n puede enviarnos un mensaje a trav?s de MyChart. Por lo general respondemos a los mensajes de MyChart en el transcurso de 1 a 2 d?as h?biles. ? ?Para renovar recetas, por favor pida a su farmacia que se ponga en contacto con nuestra oficina. Nuestro n?mero de fax es el [phone]. ? ?Si tiene un asunto urgente cuando la cl?nica est? cerrada y que no puede esperar hasta el siguiente d?a h?bil, puede llamar/localizar a su doctor(a) al n?mero que aparece a continuaci?n.  ? ?Por favor, tenga en cuenta que aunque hacemos todo lo posible para estar disponibles para asuntos urgentes fuera del horario de oficina, no estamos disponibles las 24 horas del d?a, los 7 d?as de la semana.  ? ?Si tiene un problema urgente y no puede comunicarse con nosotros, puede optar por buscar atenci?n  m?dica  en el consultorio de su doctor(a), en una cl?nica privada, en un centro de atenci?n urgente o en una sala de emergencias. ? ?Si tiene una emergencia m?dica, por favor llame inmediatamente al 91

## 2021-10-03 ENCOUNTER — Encounter: Payer: Self-pay | Admitting: Dermatology

## 2021-12-12 ENCOUNTER — Other Ambulatory Visit: Payer: Self-pay | Admitting: *Deleted

## 2021-12-12 DIAGNOSIS — I7121 Aneurysm of the ascending aorta, without rupture: Secondary | ICD-10-CM

## 2022-02-06 ENCOUNTER — Ambulatory Visit
Admission: RE | Admit: 2022-02-06 | Discharge: 2022-02-06 | Disposition: A | Discharge status: Home / Self Care | Payer: Medicare PPO | Source: Ambulatory Visit | Attending: Surgery | Admitting: Surgery

## 2022-02-06 ENCOUNTER — Ambulatory Visit: Payer: Medicare PPO | Admitting: Physician Assistant

## 2022-02-06 VITALS — BP 133/86 | HR 66 | Resp 18 | Ht 68.0 in | Wt 200.0 lb

## 2022-02-06 DIAGNOSIS — I7121 Aneurysm of the ascending aorta, without rupture: Secondary | ICD-10-CM | POA: Diagnosis not present

## 2022-02-06 DIAGNOSIS — I2510 Atherosclerotic heart disease of native coronary artery without angina pectoris: Secondary | ICD-10-CM | POA: Diagnosis not present

## 2022-02-06 DIAGNOSIS — I712 Thoracic aortic aneurysm, without rupture, unspecified: Secondary | ICD-10-CM | POA: Diagnosis not present

## 2022-02-06 DIAGNOSIS — J984 Other disorders of lung: Secondary | ICD-10-CM | POA: Diagnosis not present

## 2022-02-06 DIAGNOSIS — I700 Atherosclerosis of aorta: Secondary | ICD-10-CM | POA: Diagnosis not present

## 2022-02-06 MED ORDER — ATORVASTATIN CALCIUM 20 MG PO TABS: 20.0000 mg | ORAL_TABLET | Freq: Every day | ORAL | 4 refills | Status: DC

## 2022-02-06 MED ORDER — IOPAMIDOL (ISOVUE-370) INJECTION 76%
75.0000 mL | Freq: Once | INTRAVENOUS | Status: AC | PRN
  Administered 2022-02-06: 75 mL via INTRAVENOUS

## 2022-02-06 NOTE — Progress Notes (Signed)
[address]       [place] [address]             [phone]        AUDI WETTSTEIN [id number] June 15, 1945   History of Present Illness:  Adam Sherman is a 76 year old male with a past medical history of hypertension, GERD and asthma. He was seen by Dr. Cyndia Bent last year after an incidental finding of a 4.4cm fusiform ascending aortic aneurysm on CT on 11/26/2020 when scanned for possible interstitial lung disease after complaints of ongoing cough and congestion. He does have a possible family history of aortic dissection in his father. He presents today for a 1 year ascending thoracic aneurysm surveillance appointment with CTA. Patient feels well today. Has no complaints of chest pain, shortness of breath, or syncope. His CTA taken today 02/06/2022 shows an unchanged 4.3cm fusiform ascending thoracic aortic aneurysm.    Current Outpatient Medications on File Prior to Visit  Medication Sig Dispense Refill   amLODipine (NORVASC) 5 MG tablet Take 5 mg by mouth daily.     famotidine (PEPCID) 20 MG tablet One at bedtime 30 tablet 11   losartan-hydrochlorothiazide (HYZAAR) 100-25 MG per tablet Take 1 tablet by mouth daily.     omeprazole (PRILOSEC) 40 MG capsule Take 30-60 min before first meal of the day 30 capsule 2   No current facility-administered medications on file prior to visit.    Vitals:   02/06/22 1310  BP: 133/86  Pulse: 66  Resp: 18  SpO2: 97%    Physical Exam  CV: Normal sinus rhythm, no murmur  Pulm: Clear to auscultation  Pulses: Equal bilaterally  Extremities: No edema   CTA Results:  CLINICAL DATA:  Aortic aneurysm   EXAM: CT ANGIOGRAPHY CHEST WITH CONTRAST   TECHNIQUE: Multidetector CT imaging of the chest was performed using the standard protocol during bolus administration of intravenous contrast. Multiplanar CT image reconstructions and MIPs were obtained to evaluate the vascular anatomy.   RADIATION DOSE  REDUCTION: This exam was performed according to the departmental dose-optimization program which includes automated exposure control, adjustment of the mA and/or kV according to patient size and/or use of iterative reconstruction technique.   CONTRAST:  60m ISOVUE-370 IOPAMIDOL (ISOVUE-370) INJECTION 76%   COMPARISON:  11/26/2020   FINDINGS: Cardiovascular: Preferential opacification of the thoracic aorta. Unchanged enlargement of the tubular ascending thoracic aorta, measuring up to 4.3 x 4.3 cm in caliber. Aortic valve measures up to 2.6 cm in caliber. Sinuses of Valsalva measure up to 4.1 cm. Descending thoracic aorta measures up to 2.8 x 2.8 cm. Scattered aortic atherosclerosis. Normal heart size. Left coronary artery calcifications. No pericardial effusion.   Mediastinum/Nodes: No enlarged mediastinal, hilar, or axillary lymph nodes. Thyroid gland, trachea, and esophagus demonstrate no significant findings.   Lungs/Pleura: Unchanged bandlike scarring of the left lung base with volume loss and elevation of the left hemidiaphragm. No pleural effusion or pneumothorax.   Upper Abdomen: No acute abnormality.   Musculoskeletal: No chest wall abnormality. No acute osseous findings.   Review of the MIP images confirms the above findings.   IMPRESSION: 1. Unchanged enlargement of the tubular ascending thoracic aorta, measuring up to 4.3 x 4.3 cm in caliber. Recommend annual imaging followup by CTA or MRA. This recommendation follows 2010 ACCF/AHA/AATS/ACR/ASA/SCA/SCAI/SIR/STS/SVM Guidelines for the Diagnosis and Management of Patients with Thoracic Aortic Disease. Circulation. 2010; 121:: [id number] Aortic aneurysm NOS (ICD10-I71.9) 2. Scattered aortic atherosclerosis. 3. Coronary  artery disease.   Aortic Atherosclerosis (ICD10-I70.0).     Electronically Signed   By: Delanna Ahmadi M.D.   On: 02/06/2022 12:27    A/P:  Adam Sherman is a 76 year old male with a past  medical history of hypertension. He presents today for his 1 year thoracic aortic aneurysm surveillance appointment. His CTA shows an unchanged 4.3cm fusiform ascending aortic aneurysm. Patient feels well today with no new complaints and his blood pressure is well controlled on Norvasc and Hyzaar. He continues to monitor his blood pressure at home and sees his PCP for hypertension maintenance. I counseled him on the importance of good blood pressure control. He was not taking a statin so I started him on daily Lipitor for aortic aneurysm risk modification. I counseled him and his wife on the signs and symptoms of aortic dissection and rupture. I also counseled him on heavy lifting restrictions. He has not received an echocardiogram in the past so I will plan for an echocardiogram to check for valvular abnormalities such as a bicuspid aortic valve. Plan for 1 year aneurysm surveillance follow up with CTA of the chest.    Magdalene River, PA-C 02/06/22

## 2022-02-06 NOTE — Addendum Note (Signed)
Addended by: Wynelle Beckmann on: 02/06/2022 02:06 PM   Modules accepted: Level of Service

## 2022-02-06 NOTE — Patient Instructions (Signed)
Continue to monitor blood pressure and follow up with PCP for hypertension maintenance   Continue heavy lifting restrictions

## 2022-04-02 ENCOUNTER — Other Ambulatory Visit: Payer: Self-pay | Admitting: Internal Medicine

## 2022-04-03 ENCOUNTER — Ambulatory Visit: Payer: Medicare PPO | Admitting: Dermatology

## 2022-04-03 DIAGNOSIS — L821 Other seborrheic keratosis: Secondary | ICD-10-CM | POA: Diagnosis not present

## 2022-04-03 DIAGNOSIS — Z5111 Encounter for antineoplastic chemotherapy: Secondary | ICD-10-CM

## 2022-04-03 DIAGNOSIS — L578 Other skin changes due to chronic exposure to nonionizing radiation: Secondary | ICD-10-CM | POA: Diagnosis not present

## 2022-04-03 DIAGNOSIS — Z79899 Other long term (current) drug therapy: Secondary | ICD-10-CM | POA: Diagnosis not present

## 2022-04-03 DIAGNOSIS — L570 Actinic keratosis: Secondary | ICD-10-CM

## 2022-04-03 DIAGNOSIS — L820 Inflamed seborrheic keratosis: Secondary | ICD-10-CM | POA: Diagnosis not present

## 2022-04-03 NOTE — Patient Instructions (Addendum)
Start November 15   - Start 5-fluorouracil/calcipotriene cream twice a day for 7 days to affected areas including scalp and forehead . Prescription sent to [facility]. Patient advised they will receive an email to purchase the medication online and have it sent to their home. Patient provided with handout reviewing treatment course and side effects and advised to call or message [place] on MyChart with any concerns.   Instructions for [facility] Medications  One or more of your medications was sent to the [facility] mail order compound[facility] pharmacy. You will receive an email from them and can purchase the medicine through that link. It will then be mailed to your home at the address you confirmed. If for any reason you do not receive an email from them, please check your spam folder. If you still do not find the email, please let us know. [facility] phone number is [phone].   5-Fluorouracil/Calcipotriene Patient Education   Actinic keratoses are the dry, red scaly spots on the skin caused by sun damage. A portion of these spots can turn into skin cancer with time, and treating them can help prevent development of skin cancer.   Treatment of these spots requires removal of the defective skin cells. There are various ways to remove actinic keratoses, including freezing with liquid nitrogen, treatment with creams, or treatment with a blue light procedure in the office.   5-fluorouracil cream is a topical cream used to treat actinic keratoses. It works by interfering with the growth of abnormal fast-growing skin cells, such as actinic keratoses. These cells peel off and are replaced by healthy ones.   5-fluorouracil/calcipotriene is a combination of the 5-fluorouracil cream with a vitamin D analog cream called calcipotriene. The calcipotriene alone does not treat actinic keratoses. However, when it is combined with 5-fluorouracil, it helps the 5-fluorouracil  treat the actinic keratoses much faster so that the same results can be achieved with a much shorter treatment time.  INSTRUCTIONS FOR 5-FLUOROURACIL/CALCIPOTRIENE CREAM:   5-fluorouracil/calcipotriene cream typically only needs to be used for 4-7 days. A thin layer should be applied twice a day to the treatment areas recommended by your physician.   If your physician prescribed you separate tubes of 5-fluourouracil and calcipotriene, apply a thin layer of 5-fluorouracil followed by a thin layer of calcipotriene.   Avoid contact with your eyes, nostrils, and mouth. Do not use 5-fluorouracil/calcipotriene cream on infected or open wounds.   You will develop redness, irritation and some crusting at areas where you have pre-cancer damage/actinic keratoses. IF YOU DEVELOP PAIN, BLEEDING, OR SIGNIFICANT CRUSTING, STOP THE TREATMENT EARLY - you have already gotten a good response and the actinic keratoses should clear up well.  Wash your hands after applying 5-fluorouracil 5% cream on your skin.   A moisturizer or sunscreen with a minimum SPF 30 should be applied each morning.   Once you have finished the treatment, you can apply a thin layer of Vaseline twice a day to irritated areas to soothe and calm the areas more quickly. If you experience significant discomfort, contact your physician.  For some patients it is necessary to repeat the treatment for best results.  SIDE EFFECTS: When using 5-fluorouracil/calcipotriene cream, you may have mild irritation, such as redness, dryness, swelling, or a mild burning sensation. This usually resolves within 2 weeks. The more actinic keratoses you have, the more redness and inflammation you can expect during treatment. Eye irritation has been reported rarely. If this occurs, please let  us know.  If you have any trouble using this cream, please call the office. If you have any other questions about this information, please do not hesitate to ask me before you  leave the office.   Cryotherapy Aftercare  Wash gently with soap and water everyday.   Apply Vaseline and Band-Aid daily until healed.      Due to recent changes in healthcare laws, you may see results of your pathology and/or laboratory studies on MyChart before the doctors have had a chance to review them. We understand that in some cases there may be results that are confusing or concerning to you. Please understand that not all results are received at the same time and often the doctors may need to interpret multiple results in order to provide you with the best plan of care or course of treatment. Therefore, we ask that you please give [place] 2 business days to thoroughly review all your results before contacting the office for clarification. Should we see a critical lab result, you will be contacted sooner.   If You Need Anything After Your Visit  If you have any questions or concerns for your doctor, please call our main line at [phone] and press option 4 to reach your doctor's medical assistant. If no one answers, please leave a voicemail as directed and we will return your call as soon as possible. Messages left after 4 pm will be answered the following business day.   You may also send [place] a message via [place]. We typically respond to MyChart messages within 1-2 business days.  For prescription refills, please ask your pharmacy to contact our office. Our fax number is [phone].  If you have an urgent issue when the clinic is closed that cannot wait until the next business day, you can page your doctor at the number below.    Please note that while we do our best to be available for urgent issues outside of office hours, we are not available 24/7.   If you have an urgent issue and are unable to reach [place], you may choose to seek medical care at your doctor's office, retail clinic, urgent care center, or emergency room.  If you have a medical emergency, please immediately call  911 or go to the emergency department.  Pager Numbers  - Dr. Nehemiah Massed: [phone]  - Dr. Laurence Ferrari: [phone]  - Dr. Nicole Kindred: [phone]  In the event of inclement weather, please call our main line at [phone] for an update on the status of any delays or closures.  Dermatology Medication Tips: Please keep the boxes that topical medications come in in order to help keep track of the instructions about where and how to use these. Pharmacies typically print the medication instructions only on the boxes and not directly on the medication tubes.   If your medication is too expensive, please contact our office at [phone] option 4 or send [place] a message through [place].   We are unable to tell what your co-pay for medications will be in advance as this is different depending on your insurance coverage. However, we may be able to find a substitute medication at lower cost or fill out paperwork to get insurance to cover a needed medication.   If a prior authorization is required to get your medication covered by your insurance company, please allow [place] 1-2 business days to complete this process.  Drug prices often vary depending on where the prescription is filled and some pharmacies may offer  cheaper prices.  The website www.goodrx.com contains coupons for medications through different pharmacies. The prices here do not account for what the cost may be with help from insurance (it may be cheaper with your insurance), but the website can give you the price if you did not use any insurance.  - You can print the associated coupon and take it with your prescription to the pharmacy.  - You may also stop by our office during regular business hours and pick up a GoodRx coupon card.  - If you need your prescription sent electronically to a different pharmacy, notify our office through [facility] or by phone at [phone] option 4.     Si Usted Necesita Algo Despus de Su  Visita  Tambin puede enviarnos un mensaje a travs de Pharmacist, community. Por lo general respondemos a los mensajes de MyChart en el transcurso de 1 a 2 das hbiles.  Para renovar recetas, por favor pida a su farmacia que se ponga en contacto con nuestra oficina. Harland Dingwall de fax es [place] [phone].  Si tiene un asunto urgente cuando la clnica est cerrada y que no puede esperar hasta el siguiente da hbil, puede llamar/localizar a su doctor(a) al nmero que aparece a continuacin.   Por favor, tenga en cuenta que aunque hacemos todo lo posible para estar disponibles para asuntos urgentes fuera del horario de [place], no estamos disponibles las 24 horas del da, los 7 das de la [place].   Si tiene un problema urgente y no puede comunicarse con nosotros, puede optar por buscar atencin mdica  en el consultorio de su doctor(a), en una clnica privada, en un centro de atencin urgente o en una sala de emergencias.  Si tiene Engineering geologist, por favor llame inmediatamente al 911 o vaya a la sala de emergencias.  Nmeros de bper  - Dr. Nehemiah Massed: [phone]  - Dra. Moye: [phone]  - Dra. Nicole Kindred: [phone]  En caso de inclemencias del [place], por favor llame a Johnsie Kindred principal al [phone] para una actualizacin sobre el [place] de cualquier retraso o cierre.  Consejos para la medicacin en dermatologa: Por favor, guarde las cajas en las que vienen los medicamentos de uso tpico para ayudarle a seguir las instrucciones sobre dnde y cmo usarlos. Las farmacias generalmente imprimen las instrucciones del medicamento slo en las cajas y no directamente en los tubos del [place].   Si su medicamento es muy caro, por favor, pngase en contacto con Zigmund Daniel llamando al [phone] y presione la opcin 4 o envenos un mensaje a travs de Pharmacist, community.   No podemos decirle cul ser su copago por los medicamentos por adelantado ya que esto es diferente dependiendo de la  cobertura de su seguro. Sin embargo, es posible que podamos encontrar un medicamento sustituto a Electrical engineer un formulario para que el seguro cubra el medicamento que se considera necesario.   Si se requiere una autorizacin previa para que su compaa de seguros [place] su medicamento, por favor permtanos de 1 a 2 das hbiles para completar este proceso.  Los precios de los medicamentos varan con frecuencia dependiendo del Environmental consultant de dnde se surte la receta y alguna farmacias pueden ofrecer precios ms baratos.  El sitio web www.goodrx.com tiene cupones para medicamentos de Airline pilot. Los precios aqu no tienen en cuenta lo que podra costar con la ayuda del seguro (puede ser ms barato con su seguro), pero el sitio web puede darle el precio si no utiliz ningn  seguro.  - Puede imprimir el cupn correspondiente y llevarlo con su receta a la farmacia.  - Tambin puede pasar por nuestra oficina durante el horario de atencin regular y Charity fundraiser una tarjeta de cupones de GoodRx.  - Si necesita que su receta se enve electrnicamente a una farmacia diferente, informe a nuestra oficina a travs de MyChart de [place] o por telfono llamando al [phone] y presione la opcin 4.

## 2022-04-03 NOTE — Progress Notes (Signed)
Follow-Up Visit   Subjective  Adam Sherman is a 76 y.o. male who presents for the following: Actinic Keratosis (6 month ak follow up spot at right side of neck , back of left arm ). The patient has spots, moles and lesions to be evaluated, some may be new or changing and the patient has concerns that these could be cancer.  The following portions of the chart were reviewed this encounter and updated as appropriate:  Tobacco  Allergies  Meds  Problems  Med Hx  Surg Hx  Fam Hx     Review of Systems: No other skin or systemic complaints except as noted in HPI or Assessment and Plan.  Objective  Well appearing patient in no apparent distress; mood and affect are within normal limits.  A focused examination was performed including face, neck, arms, hands,. Relevant physical exam findings are noted in the Assessment and Plan.  face, scalp , ears x 17 (17) Erythematous thin papules/macules with gritty scale.   right neck x 1, left tricep near elbow x 1 (2) Erythematous stuck-on, waxy papule or plaque   Assessment & Plan  Actinic keratosis (17) face, scalp , ears x 17 start 5-fluorouracil/calcipotriene cream twice a day for 7 days to affected areas including scalp . Prescription sent to [facility]. Patient advised they will receive an email to purchase the medication online and have it sent to their home. Patient provided with handout reviewing treatment course and side effects and advised to call or message [place] on MyChart with any concerns.  Actinic keratoses are precancerous spots that appear secondary to cumulative UV radiation exposure/sun exposure over time. They are chronic with expected duration over 1 year. A portion of actinic keratoses will progress to squamous cell carcinoma of the skin. It is not possible to reliably predict which spots will progress to skin cancer and so treatment is recommended to prevent development of skin  cancer.  Recommend daily broad spectrum sunscreen SPF 30+ to sun-exposed areas, reapply every 2 hours as needed.  Recommend staying in the shade or wearing long sleeves, sun glasses (UVA+UVB protection) and wide brim hats (4-inch brim around the entire circumference of the hat). Call for new or changing lesions.  Destruction of lesion - face, scalp , ears x 17 Complexity: simple   Destruction method: cryotherapy   Informed consent: discussed and consent obtained   Timeout:  patient name, date of birth, surgical site, and procedure verified Lesion destroyed using liquid nitrogen: Yes   Region frozen until ice ball extended beyond lesion: Yes   Outcome: patient tolerated procedure well with no complications   Post-procedure details: wound care instructions given   Additional details:  Prior to procedure, discussed risks of blister formation, small wound, skin dyspigmentation, or rare scar following cryotherapy. Recommend Vaseline ointment to treated areas while healing.  Inflamed seborrheic keratosis (2) right neck x 1, left tricep near elbow x 1 Symptomatic, irritating, patient would like treated. Destruction of lesion - right neck x 1, left tricep near elbow x 1 Complexity: simple   Destruction method: cryotherapy   Informed consent: discussed and consent obtained   Timeout:  patient name, date of birth, surgical site, and procedure verified Lesion destroyed using liquid nitrogen: Yes   Region frozen until ice ball extended beyond lesion: Yes   Outcome: patient tolerated procedure well with no complications   Post-procedure details: wound care instructions given   Additional details:  Prior to procedure, discussed risks of  blister formation, small wound, skin dyspigmentation, or rare scar following cryotherapy. Recommend Vaseline ointment to treated areas while healing.  Seborrheic Keratoses Right neck and posterior left arm  - Stuck-on, waxy, tan-brown papules and/or plaques  -  Benign-appearing - Discussed benign etiology and prognosis. - Observe - Call for any changes  Actinic Damage - Severe, confluent actinic changes with pre-cancerous actinic keratoses  - Severe, chronic, not at goal, secondary to cumulative UV radiation exposure over time - diffuse scaly erythematous macules and papules with underlying dyspigmentation - Discussed Prescription "Field Treatment" for Severe, Chronic Confluent Actinic Changes with Pre-Cancerous Actinic Keratoses Field treatment involves treatment of an entire area of skin that has confluent Actinic Changes (Sun/ Ultraviolet light damage) and PreCancerous Actinic Keratoses by method of PhotoDynamic Therapy (PDT) and/or prescription Topical Chemotherapy agents such as 5-fluorouracil, 5-fluorouracil/calcipotriene, and/or imiquimod.  The purpose is to decrease the number of clinically evident and subclinical PreCancerous lesions to prevent progression to development of skin cancer by chemically destroying early precancer changes that may or may not be visible.  It has been shown to reduce the risk of developing skin cancer in the treated area. As a result of treatment, redness, scaling, crusting, and open sores may occur during treatment course. One or more than one of these methods may be used and may have to be used several times to control, suppress and eliminate the PreCancerous changes. Discussed treatment course, expected reaction, and possible side effects. - Recommend daily broad spectrum sunscreen SPF 30+ to sun-exposed areas, reapply every 2 hours as needed.  - Staying in the shade or wearing long sleeves, sun glasses (UVA+UVB protection) and wide brim hats (4-inch brim around the entire circumference of the hat) are also recommended. - Call for new or changing lesions.  - Start 5-fluorouracil/calcipotriene cream twice a day for 7 days to affected areas including scalp and forehead. Prescription sent to [facility]. Patient advised they will receive an email to purchase the medication online and have it sent to their home. Patient provided with handout reviewing treatment course and side effects and advised to call or message [place] on MyChart with any concerns.  Reviewed course of treatment and expected reaction.  Patient advised to expect inflammation and crusting and advised that erosions are possible.  Patient advised to be diligent with sun protection during and after treatment. Counseled to keep medication out of reach of children and pets.  Return in about 6 months (around 10/03/2022) for ak followup.  IRuthell Rummage, CMA, am acting as scribe for Sarina Ser, MD. Documentation: I have reviewed the above documentation for accuracy and completeness, and I agree with the above.  Sarina Ser, MD

## 2022-04-09 ENCOUNTER — Encounter: Payer: Self-pay | Admitting: Dermatology

## 2022-04-10 DIAGNOSIS — Z125 Encounter for screening for malignant neoplasm of prostate: Secondary | ICD-10-CM | POA: Diagnosis not present

## 2022-04-10 DIAGNOSIS — Z0000 Encounter for general adult medical examination without abnormal findings: Secondary | ICD-10-CM | POA: Diagnosis not present

## 2022-04-10 DIAGNOSIS — E7800 Pure hypercholesterolemia, unspecified: Secondary | ICD-10-CM | POA: Diagnosis not present

## 2022-04-13 ENCOUNTER — Emergency Department (HOSPITAL_COMMUNITY): Payer: Medicare PPO

## 2022-04-13 ENCOUNTER — Encounter (HOSPITAL_COMMUNITY): Payer: Self-pay | Admitting: Emergency Medicine

## 2022-04-13 ENCOUNTER — Other Ambulatory Visit: Payer: Self-pay

## 2022-04-13 ENCOUNTER — Observation Stay (HOSPITAL_COMMUNITY)
Admission: EM | Admit: 2022-04-13 | Discharge: 2022-04-14 | Disposition: A | Discharge status: Home / Self Care | Payer: Medicare PPO | Attending: Internal Medicine | Admitting: Internal Medicine

## 2022-04-13 ENCOUNTER — Encounter (HOSPITAL_COMMUNITY): Payer: Self-pay

## 2022-04-13 DIAGNOSIS — R4182 Altered mental status, unspecified: Secondary | ICD-10-CM | POA: Insufficient documentation

## 2022-04-13 DIAGNOSIS — Z6839 Body mass index (BMI) 39.0-39.9, adult: Secondary | ICD-10-CM | POA: Insufficient documentation

## 2022-04-13 DIAGNOSIS — I10 Essential (primary) hypertension: Secondary | ICD-10-CM | POA: Diagnosis present

## 2022-04-13 DIAGNOSIS — Z85828 Personal history of other malignant neoplasm of skin: Secondary | ICD-10-CM | POA: Diagnosis not present

## 2022-04-13 DIAGNOSIS — I6523 Occlusion and stenosis of bilateral carotid arteries: Secondary | ICD-10-CM | POA: Diagnosis not present

## 2022-04-13 DIAGNOSIS — K219 Gastro-esophageal reflux disease without esophagitis: Secondary | ICD-10-CM | POA: Insufficient documentation

## 2022-04-13 DIAGNOSIS — J45909 Unspecified asthma, uncomplicated: Secondary | ICD-10-CM | POA: Insufficient documentation

## 2022-04-13 DIAGNOSIS — G459 Transient cerebral ischemic attack, unspecified: Secondary | ICD-10-CM | POA: Diagnosis not present

## 2022-04-13 DIAGNOSIS — Z79899 Other long term (current) drug therapy: Secondary | ICD-10-CM | POA: Diagnosis not present

## 2022-04-13 DIAGNOSIS — E876 Hypokalemia: Secondary | ICD-10-CM | POA: Diagnosis present

## 2022-04-13 DIAGNOSIS — E782 Mixed hyperlipidemia: Secondary | ICD-10-CM | POA: Diagnosis not present

## 2022-04-13 DIAGNOSIS — G454 Transient global amnesia: Secondary | ICD-10-CM

## 2022-04-13 DIAGNOSIS — R410 Disorientation, unspecified: Secondary | ICD-10-CM | POA: Diagnosis not present

## 2022-04-13 DIAGNOSIS — Z87891 Personal history of nicotine dependence: Secondary | ICD-10-CM | POA: Diagnosis not present

## 2022-04-13 DIAGNOSIS — E669 Obesity, unspecified: Secondary | ICD-10-CM | POA: Diagnosis not present

## 2022-04-13 LAB — PROTIME-INR
INR: 1 (ref 0.8–1.2)
Prothrombin Time: 12.8 seconds (ref 11.4–15.2)

## 2022-04-13 LAB — URINALYSIS, ROUTINE W REFLEX MICROSCOPIC
Bilirubin Urine: NEGATIVE
Glucose, UA: NEGATIVE mg/dL
Hgb urine dipstick: NEGATIVE
Ketones, ur: NEGATIVE mg/dL
Leukocytes,Ua: NEGATIVE
Nitrite: NEGATIVE
Protein, ur: NEGATIVE mg/dL
Specific Gravity, Urine: 1.002 — ABNORMAL LOW (ref 1.005–1.030)
pH: 7 (ref 5.0–8.0)

## 2022-04-13 LAB — DIFFERENTIAL
Abs Immature Granulocytes: 0.02 10*3/uL (ref 0.00–0.07)
Basophils Absolute: 0.1 10*3/uL (ref 0.0–0.1)
Basophils Relative: 1 %
Eosinophils Absolute: 0.1 10*3/uL (ref 0.0–0.5)
Eosinophils Relative: 2 %
Immature Granulocytes: 0 %
Lymphocytes Relative: 24 %
Lymphs Abs: 1.7 10*3/uL (ref 0.7–4.0)
Monocytes Absolute: 0.5 10*3/uL (ref 0.1–1.0)
Monocytes Relative: 7 %
Neutro Abs: 4.6 10*3/uL (ref 1.7–7.7)
Neutrophils Relative %: 66 %

## 2022-04-13 LAB — CBC
HCT: 42.2 % (ref 39.0–52.0)
Hemoglobin: 14.3 g/dL (ref 13.0–17.0)
MCH: 30.4 pg (ref 26.0–34.0)
MCHC: 33.9 g/dL (ref 30.0–36.0)
MCV: 89.8 fL (ref 80.0–100.0)
Platelets: 191 10*3/uL (ref 150–400)
RBC: 4.7 MIL/uL (ref 4.22–5.81)
RDW: 13.3 % (ref 11.5–15.5)
WBC: 7 10*3/uL (ref 4.0–10.5)
nRBC: 0 % (ref 0.0–0.2)

## 2022-04-13 LAB — COMPREHENSIVE METABOLIC PANEL
ALT: 34 U/L (ref 0–44)
AST: 32 U/L (ref 15–41)
Albumin: 4.1 g/dL (ref 3.5–5.0)
Alkaline Phosphatase: 54 U/L (ref 38–126)
Anion gap: 7 (ref 5–15)
BUN: 13 mg/dL (ref 8–23)
CO2: 28 mmol/L (ref 22–32)
Calcium: 8.9 mg/dL (ref 8.9–10.3)
Chloride: 100 mmol/L (ref 98–111)
Creatinine, Ser: 1.2 mg/dL (ref 0.61–1.24)
GFR, Estimated: >60 mL/min (ref >60)
Glucose, Bld: 132 mg/dL — ABNORMAL HIGH (ref 70–99)
Potassium: 3.3 mmol/L — ABNORMAL LOW (ref 3.5–5.1)
Sodium: 135 mmol/L (ref 135–145)
Total Bilirubin: 0.8 mg/dL (ref 0.3–1.2)
Total Protein: 7.6 g/dL (ref 6.5–8.1)

## 2022-04-13 LAB — APTT: aPTT: 30 seconds (ref 24–36)

## 2022-04-13 LAB — ETHANOL: Alcohol, Ethyl (B): <10 mg/dL (ref <10)

## 2022-04-13 LAB — CBG MONITORING, ED: Glucose-Capillary: 167 mg/dL — ABNORMAL HIGH (ref 70–99)

## 2022-04-13 MED ORDER — SODIUM CHLORIDE 0.9 % IV BOLUS: 500.0000 mL | Freq: Once | INTRAVENOUS | Status: DC

## 2022-04-13 MED ORDER — IOHEXOL 350 MG/ML SOLN
75.0000 mL | Freq: Once | INTRAVENOUS | Status: AC | PRN
  Administered 2022-04-13: 75 mL via INTRAVENOUS

## 2022-04-13 NOTE — ED Triage Notes (Addendum)
Pt c/o "a lapse of time I dont remember after I got out of the shower". Pt states he feels normal now. Pt is a/o at this time. Wife states found him sitting at table with a blank stare with little response to her questions. [facility] in triage negative. Got in shower about 310pm-LKW. EDP aware at 1810

## 2022-04-13 NOTE — ED Notes (Signed)
Pt to CT

## 2022-04-13 NOTE — Progress Notes (Signed)
ON CALL PHONE CONSULT  Call from Alicia PA'[email or web address]  Sherman   Discussion: Patient with brief duration of amnesia, repetitive questioning, blank stare at times, now back to baseline.  Concerning for TGA/TIA versus seizure. Recommend admission to [facility], MRI brain without contrast in the morning, CT angio head and neck, 2D echo, A1c lipid panel.  Routine EEG in the morning. Obtain formal consultation with Dr. [facility] in the morning.   -- [name], MD Neurologist [facility] Pager: [phone]

## 2022-04-13 NOTE — ED Provider Notes (Addendum)
[facility] Provider Note   CSN: [id number] Arrival date & time: 04/13/22  1756     History  Chief Complaint  Patient presents with   Altered Mental Status    Adam Sherman is a 76 y.o. male with Hx of asthma, thoracic aortic aneurysm, HTN, and GERD presenting today due to a period of altered mental status.  Patient got in the shower around 3:10 PM, remembers getting out of the shower, however details following this were hazy.  Patient states he is now able to recollect a few extra details that he initially was unable to, confirmed by patient's spouse at bedside.  Patient initially remembers exiting the shower and then finding himself at the dinner table downstairs.  Not able to recall grabbing his pants from the chair, pulling a T-shirt out of the drawer, and being handed an over shirt/jacket.  However still does not remember going down stairs, sitting at the table, or conversing with his wife.    Patient's wife states she found him at the table around 3:30 PM staring at his close.  Patient's wife stated "I am ready to go", to which the patient responded "where we going?".  Spouse asked the patient why he was staring at his close to which the patient responded "I do not know how I got dressed".  Per spouse, patient was oriented to self, location, though not to event or time.  Patient had a blank stare with no emotion.  Spouse had the patient smile, did not note facial asymmetry.  Had patient walk around and move, to which the patient did not appear to be walking abnormally or have any obvious coordination issues.  Patient's spouse described a negative pronator drift.  Spouse reports that the patient appeared to be "spacing out".  Patient asked 3 times "is it Thursday".  Again patient states he has been slowly regaining bits and pieces of the events.  Feels much better, however has never had something like this happen before.  No Hx of CVA or seizure.  Denies any complaints  at this time.  Is supposed to have his annual physical on Monday.  The history is provided by the patient and medical records.  Altered Mental Status Presenting symptoms: confusion        Home Medications Prior to Admission medications   Medication Sig Start Date End Date Taking? Authorizing Provider  amLODipine (NORVASC) 5 MG tablet Take 5 mg by mouth daily. 05/31/15  Yes [provider]  famotidine (PEPCID) 20 MG tablet TAKE ONE TABLET BY MOUTH AT BEDTIME AS NEEDED. 04/03/22  Yes Tanda Rockers, MD  GLUCOSAMINE HCL-MSM PO Take 1 tablet by mouth daily.   Yes [provider]  Homeopathic Products (ARNICARE ARTHRITIS) TBDP Take by mouth.   Yes [provider]  ibuprofen (ADVIL) 400 MG tablet Take 400 mg by mouth every 6 (six) hours as needed.   Yes [provider]  losartan-hydrochlorothiazide (HYZAAR) 100-25 MG per tablet Take 1 tablet by mouth daily.   Yes [provider]  omeprazole (PRILOSEC) 40 MG capsule Take 30-60 min before first meal of the day 10/25/20  Yes Tanda Rockers, MD  atorvastatin (LIPITOR) 20 MG tablet Take 1 tablet (20 mg total) by mouth daily. Patient not taking: Reported on 04/13/2022 02/06/22   Magdalene River, PA-C      Allergies    Patient has no known allergies.    Review of Systems   Review of Systems  Psychiatric/Behavioral:  Positive for confusion.     Physical Exam Updated Vital Signs BP (!) 147/96   Pulse 64   Temp (!) 97.5 F (36.4 C) (Oral)   Resp 16   Ht '5\' 8"'$  (1.727 m)   Wt 89.4 kg   SpO2 99%   BMI 29.95 kg/m  Physical Exam Vitals and nursing note reviewed.  Constitutional:      General: He is not in acute distress.    Appearance: He is well-developed.  HENT:     Head: Normocephalic and atraumatic.  Eyes:     General: Lids are normal. Gaze aligned appropriately. No visual field deficit.    Extraocular Movements: Extraocular movements intact.     Conjunctiva/sclera: Conjunctivae  normal.     Pupils: Pupils are equal, round, and reactive to light.     Visual Fields: Right eye visual fields normal and left eye visual fields normal.  Neck:     Comments: Very supple on exam, no meningismus or torticollis.  No midline tenderness. Cardiovascular:     Rate and Rhythm: Normal rate and regular rhythm.     Heart sounds: No murmur heard. Pulmonary:     Effort: Pulmonary effort is normal. No respiratory distress.     Breath sounds: Normal breath sounds. No wheezing.  Chest:     Chest wall: No tenderness.  Abdominal:     General: There is no distension.     Palpations: Abdomen is soft.     Tenderness: There is no abdominal tenderness.  Musculoskeletal:        General: No swelling.     Cervical back: Neck supple. No rigidity.  Skin:    General: Skin is warm and dry.     Capillary Refill: Capillary refill takes less than 2 seconds.     Coloration: Skin is not jaundiced or pale.  Neurological:     General: No focal deficit present.     Mental Status: He is alert and oriented to person, place, and time.     GCS: GCS eye subscore is 4. GCS verbal subscore is 5. GCS motor subscore is 6.     Cranial Nerves: No cranial nerve deficit, dysarthria or facial asymmetry.     Sensory: No sensory deficit.     Motor: No weakness, tremor or pronator drift.     Coordination: Coordination normal. Finger-Nose-Finger Test and Heel to [facility] Test normal.     Gait: Gait normal.     Comments: Mild repetitive fidgeting appreciated of the left index finger.  CN III-XII appear grossly intact.  Psychiatric:        Mood and Affect: Mood normal.        Behavior: Behavior normal.    ED Results / Procedures / Treatments   Labs (all labs ordered are listed, but only abnormal results are displayed) Labs Reviewed  COMPREHENSIVE METABOLIC PANEL - Abnormal; Notable for the following components:      Result Value   Potassium 3.3 (*)    Glucose, Bld 132 (*)    All other components within normal  limits  URINALYSIS, ROUTINE W REFLEX MICROSCOPIC - Abnormal; Notable for the following components:   Color, Urine COLORLESS (*)    Specific Gravity, Urine 1.002 (*)    All other components within normal limits  CBG MONITORING, ED - Abnormal; Notable for the following components:   Glucose-Capillary 167 (*)    All other components within normal limits  PROTIME-INR  APTT  CBC  DIFFERENTIAL  ETHANOL  I-STAT CHEM 8, ED  CBG MONITORING, ED    EKG EKG Interpretation  Date/Time:  Thursday April 13 2022 18:17:57 EDT Ventricular Rate:  70 PR Interval:  201 QRS Duration: 98 QT Interval:  404 QTC Calculation: 436 R Axis:   24 Text Interpretation: Sinus rhythm Abnormal R-wave progression, early transition No significant change since last tracing Confirmed by Dorie Rank [phone]) on 04/13/2022 6:40:55 PM  Radiology CT HEAD WO CONTRAST  Result Date: 04/13/2022 CLINICAL DATA:  Mental status change EXAM: CT HEAD WITHOUT CONTRAST TECHNIQUE: Contiguous axial images were obtained from the base of the skull through the vertex without intravenous contrast. RADIATION DOSE REDUCTION: This exam was performed according to the departmental dose-optimization program which includes automated exposure control, adjustment of the mA and/or kV according to patient size and/or use of iterative reconstruction technique. COMPARISON:  None Available. FINDINGS: Brain: No evidence of acute infarction, hemorrhage, hydrocephalus, extra-axial collection or mass lesion/mass effect. Vascular: No hyperdense vessel or unexpected calcification. Skull: Normal. Negative for fracture or focal lesion. Sinuses/Orbits: No acute finding. Other: None. IMPRESSION: No acute intracranial abnormality. Electronically Signed   By: Ronney Asters M.D.   On: 04/13/2022 18:38    Procedures Procedures    Medications Ordered in ED Medications  iohexol (OMNIPAQUE) 350 MG/ML injection 75 mL (75 mLs Intravenous Contrast Given 04/13/22 2143)     ED Course/ Medical Decision Making/ A&P Clinical Course as of 04/13/22 2235  Thu Apr 13, 2022  2118 Consulted with Dr. Malen Gauze of neurology for concerning history of possible TIA.  Recommended admission with TG/TIA work-up.  Can start with CTA head and neck and proceed with echo, EEG, MRI per protocol. [AC]  2234 Consulted with Dr. Josephine Cables of hospitalist group.  Discussed recommendations of neurology and patient case and history in detail.  Agrees with plan for admission for TIA/TG workup. [AC]    Clinical Course User Index [AC] Prince Rome, PA-C     ABCD2 Score: 3                     Medical Decision Making Amount and/or Complexity of Data Reviewed Labs: ordered. Radiology: ordered.  Risk Prescription drug management. Decision regarding hospitalization.   76 y.o. male presents to the ED for concern of Altered Mental Status     This involves an extensive number of treatment options, and is a complaint that carries with it a high risk of complications and morbidity.  The emergent differential diagnosis prior to evaluation includes, but is not limited to: TIA, metabolic encephalopathy, electrolyte disturbance, CVA  This is not an exhaustive differential.   Past Medical History / Co-morbidities / Social History: Hx of asthma, thoracic aortic aneurysm, HTN, and GERD Social Determinants of Health include: Elderly  Additional History:  Obtained by chart review.  Notably recent visit with cardiothoracic group regarding a sending aortic aneurysm without rupture, see for details.  4.3 cm in diameter.  First identified on CT 11/2020.  Without significant changes since initial imaging.  Lab Tests: I ordered, and personally interpreted labs.  The pertinent results include:   Potassium 3.3, mild Glucose 132, nonfasting No evidence of electrolyte derangement, significant anemia, or elevated white count  Imaging Studies: I ordered imaging studies including CT head, CTA head and  neck.   I independently visualized and interpreted CT head, which showed no acute findings.  CTA head and neck pending. I agree with the radiologist interpretation.  Cardiac Monitoring: The patient was maintained on a cardiac  monitor.  I personally viewed and interpreted the cardiac monitored which showed an underlying rhythm of: NSR  ED Course / Critical Interventions: Pt well-appearing on exam.  Presenting with story suspicious for TIA vs absent seizure.  Had 1 episode of about 20 minutes of confusion.  Patient was unaware that he dressed himself, went downstairs, or had a conversation with his wife.  Per patient's wife, conversation was abnormal with repetitive questions and at time irrational responses.  No facial asymmetry, discoordination, or ataxia noted at the time.  Patient became increasingly aware and overall less groggy over the next 1 to 2 hours.  Presented to the ED for evaluation.  No Hx of CVA or prior TIA. Neuro exam overall unremarkable as described above.  Nonseptic, nontoxic appearing in NAD.  AAOx4.   Mild movement of left index finger appears appropriate with inflections of conversation and does not appear to occur at random.  Lower initial suspicion for seizure.  Consulted with Dr. Malen Gauze of neurology, see note above.  Plan to proceed with CTA head and neck and admission for TG/TIA work-up. I have reviewed the patients home medicines and have made adjustments as needed.  Disposition: Admission  This chart was dictated using voice recognition software.  Despite best efforts to proofread, errors can occur which can change the documentation meaning.          Final Clinical Impression(s) / ED Diagnoses Final diagnoses:  Confusion    Rx / DC Orders ED Discharge Orders     None         Prince Rome, PA-C 17/51/02 [id number]    Prince Rome, PA-C 77/82/42 Corey Skains    Dorie Rank, MD 04/14/22 1513

## 2022-04-13 NOTE — H&P (Signed)
History and Physical    Patient: Adam Sherman:[id number] DOB: 12/01/1945 DOA: 04/13/2022 DOS: the patient was seen and examined on 04/14/2022 PCP: Deland Pretty, MD  Patient coming from: Home  Chief Complaint:  Chief Complaint  Patient presents with   Altered Mental Status   HPI: Adam Sherman is a 76 y.o. male with medical history significant of hypertension, hyperlipidemia, GERD who presents to the emergency department due to sudden onset of memory loss.  Patient went to take a shower this afternoon around 3:10 PM after which he was unable to remember what he did after coming out of the shower.  He find himself at the dinner table downstairs, but did not remember how he got there, he had a conversation with his wife while sitting at the table, but patient did not remember.  Patient was reported to be oriented only to self and location, he was also noted with blank stare, gait was normal.  Patient endorsed regaining some of his memory back by the time he was in the ED.  He denies history of seizures or stroke.  ED Course:  In the emergency department, CBC was normal, BMP was normal except for potassium of 3.3 and glucose of 132, alcohol level was less than 10, urinalysis was normal. CT head without contrast showed no acute intracranial abnormality CT angiography head and neck was negative for LVO or other emergent finding. Neurology was consulted and recommended further stroke work-up.  Hospitalist was asked to admit patient for further evaluation and management.  Review of Systems: Review of systems as noted in the HPI. All other systems reviewed and are negative.   Past Medical History:  Diagnosis Date   Arthritis    thumb   Asthma    Cataract    GERD (gastroesophageal reflux disease)    History of kidney stones    Hypertension    Squamous cell carcinoma of skin 08/05/2018   right dorsum of hand   Past Surgical History:  Procedure Laterality Date   BACK SURGERY   2012   ruptured disc   CARPAL TUNNEL RELEASE     right hand 2006   CARPAL TUNNEL RELEASE Left 2021   COLONOSCOPY  06/2016   LUMBAR LAMINECTOMY/DECOMPRESSION MICRODISCECTOMY Left 01/27/2020   Procedure: Left Lumbar Three-Four  Microdiscectomy;  Surgeon: Erline Levine, MD;  Location: [facility];  Service: Neurosurgery;  Laterality: Left;   POLYPECTOMY      Social History:  reports that he quit smoking about 51 years ago. His smoking use included cigarettes. He has a 0.30 pack-year smoking history. He has never used smokeless tobacco. He reports that he does not drink alcohol and does not use drugs.   No Known Allergies  Family History  Problem Relation Age of Onset   Colon cancer Neg Hx    Stomach cancer Neg Hx    Colon polyps Neg Hx    Esophageal cancer Neg Hx    Rectal cancer Neg Hx      Prior to Admission medications   Medication Sig Start Date End Date Taking? Authorizing Provider  amLODipine (NORVASC) 5 MG tablet Take 5 mg by mouth daily. 05/31/15  Yes [provider]  famotidine (PEPCID) 20 MG tablet TAKE ONE TABLET BY MOUTH AT BEDTIME AS NEEDED. 04/03/22  Yes Tanda Rockers, MD  GLUCOSAMINE HCL-MSM PO Take 1 tablet by mouth daily.   Yes [provider]  Homeopathic Products (ARNICARE ARTHRITIS) TBDP Take by mouth.   Yes [provider]  ibuprofen (ADVIL) 400 MG tablet Take 400 mg by mouth every 6 (six) hours as needed.   Yes [provider]  losartan-hydrochlorothiazide (HYZAAR) 100-25 MG per tablet Take 1 tablet by mouth daily.   Yes [provider]  omeprazole (PRILOSEC) 40 MG capsule Take 30-60 min before first meal of the day 10/25/20  Yes Tanda Rockers, MD  atorvastatin (LIPITOR) 20 MG tablet Take 1 tablet (20 mg total) by mouth daily. Patient not taking: Reported on 04/13/2022 02/06/22   Magdalene River, PA-C    Physical Exam: BP 118/76 (BP Location: Left Arm)   Pulse 65   Temp 98.5 F (36.9 C) (Oral)   Resp 16   Ht '5\' 8"'$   (1.727 m)   Wt 91.1 kg   SpO2 96%   BMI 30.54 kg/m   General: 76 y.o. year-old male well developed well nourished in no acute distress.  Alert and oriented x3. HEENT: NCAT, EOMI Neck: Supple, trachea medial Cardiovascular: Regular rate and rhythm with no rubs or gallops.  No thyromegaly or JVD noted.  No lower extremity edema. 2/4 pulses in all 4 extremities. Respiratory: Clear to auscultation with no wheezes or rales. Good inspiratory effort. Abdomen: Soft, nontender nondistended with normal bowel sounds x4 quadrants. Muskuloskeletal: No cyanosis, clubbing or edema noted bilaterally Neuro: CN II-XII intact, strength 5/5 x 4, sensation, reflexes intact Skin: No ulcerative lesions noted or rashes Psychiatry: Judgement and insight appear normal. Mood is appropriate for condition and setting          Labs on Admission:  Basic Metabolic Panel: Recent Labs  Lab 04/13/22 1824  NA 135  K 3.3*  CL 100  CO2 28  GLUCOSE 132*  BUN 13  CREATININE 1.20  CALCIUM 8.9   Liver Function Tests: Recent Labs  Lab 04/13/22 1824  AST 32  ALT 34  ALKPHOS 54  BILITOT 0.8  PROT 7.6  ALBUMIN 4.1   No results for input(s): "LIPASE", "AMYLASE" in the last 168 hours. No results for input(s): "AMMONIA" in the last 168 hours. CBC: Recent Labs  Lab 04/13/22 1824  WBC 7.0  NEUTROABS 4.6  HGB 14.3  HCT 42.2  MCV 89.8  PLT 191   Cardiac Enzymes: No results for input(s): "CKTOTAL", "CKMB", "CKMBINDEX", "TROPONINI" in the last 168 hours.  BNP (last 3 results) No results for input(s): "BNP" in the last 8760 hours.  ProBNP (last 3 results) No results for input(s): "PROBNP" in the last 8760 hours.  CBG: Recent Labs  Lab 04/13/22 1808  GLUCAP 167*    Radiological Exams on Admission: CT ANGIO HEAD NECK W WO CM  Result Date: 04/13/2022 CLINICAL DATA:  Initial evaluation for neuro deficit, stroke suspected. EXAM: CT ANGIOGRAPHY HEAD AND NECK TECHNIQUE: Multidetector CT imaging of the  head and neck was performed using the standard protocol during bolus administration of intravenous contrast. Multiplanar CT image reconstructions and MIPs were obtained to evaluate the vascular anatomy. Carotid stenosis measurements (when applicable) are obtained utilizing NASCET criteria, using the distal internal carotid diameter as the denominator. RADIATION DOSE REDUCTION: This exam was performed according to the departmental dose-optimization program which includes automated exposure control, adjustment of the mA and/or kV according to patient size and/or use of iterative reconstruction technique. CONTRAST:  2m OMNIPAQUE IOHEXOL 350 MG/ML SOLN COMPARISON:  CT from earlier the same day. FINDINGS: CTA NECK FINDINGS Aortic arch: Visualized aortic arch normal caliber with standard branch pattern. No significant stenosis about the origin of the great  vessels. Right carotid system: The right common and internal carotid arteries are diffusely tortuous without dissection or occlusion. Atheromatous change about the right carotid bulb without hemodynamically significant greater than 50% stenosis. Left carotid system: Left common and internal carotid arteries are diffusely tortuous without dissection or occlusion. Mild atheromatous change about the left carotid bulb without hemodynamically significant greater than 50% stenosis. Vertebral arteries: Both vertebral arteries arise from subclavian arteries. No proximal subclavian artery stenosis. Right vertebral artery dominant. Vertebral arteries are tortuous without stenosis, dissection or occlusion. Skeleton: Mild-to-moderate multilevel cervical spondylosis. No convincing worrisome osseous lesions. Other neck: No other acute soft tissue abnormality within the neck. 9 mm right thyroid nodule, of doubtful significance given size and patient age, no follow-up imaging recommended (ref: J Am Coll Radiol. 2015 Feb;12(2): 143-50). Upper chest: Visualized upper chest demonstrates  no acute finding. Review of the MIP images confirms the above findings CTA HEAD FINDINGS Anterior circulation: Both internal carotid arteries are somewhat ectatic and widely patent to the termini without stenosis. A1 segments, anterior communicating complex common anterior cerebral arteries widely patent. No M1 stenosis or occlusion. No proximal MCA branch occlusion or stenosis. Distal MCA branches perfused and symmetric. Posterior circulation: Dominant right V4 segment widely patent. Atheromatous plaque at the mid left V4 segment with associated short-segment moderate stenosis (series 6, image 279). Both PICA patent. Basilar somewhat ectatic without stenosis. Superior cerebellar and posterior cerebral arteries patent bilaterally. Venous sinuses: Patent allowing for timing the contrast bolus. Anatomic variants: None significant.  No aneurysm. Review of the MIP images confirms the above findings IMPRESSION: 1. Negative CTA for large vessel occlusion or other emergent finding. 2. Atheromatous plaque at the mid left V4 segment with associated short-segment moderate stenosis. 3. Diffuse tortuosity of the major arterial vasculature of the head and neck, suggesting chronic underlying hypertension. Electronically Signed   By: Jeannine Boga M.D.   On: 04/13/2022 22:46   CT HEAD WO CONTRAST  Result Date: 04/13/2022 CLINICAL DATA:  Mental status change EXAM: CT HEAD WITHOUT CONTRAST TECHNIQUE: Contiguous axial images were obtained from the base of the skull through the vertex without intravenous contrast. RADIATION DOSE REDUCTION: This exam was performed according to the departmental dose-optimization program which includes automated exposure control, adjustment of the mA and/or kV according to patient size and/or use of iterative reconstruction technique. COMPARISON:  None Available. FINDINGS: Brain: No evidence of acute infarction, hemorrhage, hydrocephalus, extra-axial collection or mass lesion/mass effect.  Vascular: No hyperdense vessel or unexpected calcification. Skull: Normal. Negative for fracture or focal lesion. Sinuses/Orbits: No acute finding. Other: None. IMPRESSION: No acute intracranial abnormality. Electronically Signed   By: Ronney Asters M.D.   On: 04/13/2022 18:38    EKG: I independently viewed the EKG done and my findings are as followed: Normal sinus rhythm at a rate of 70 bpm  Assessment/Plan Present on Admission:  TIA (transient ischemic attack)  Hypokalemia  Obesity (BMI 30-39.9)  Essential hypertension  Mixed hyperlipidemia  Principal Problem:   TIA (transient ischemic attack) Active Problems:   Altered mental status   Hypokalemia   Obesity (BMI 30-39.9)   Essential hypertension   Mixed hyperlipidemia   GERD (gastroesophageal reflux disease)  Altered mental status possibly secondary to TIA versus TGA Patient will be admitted to telemetry unit  CT head without contrast showed no acute intracranial abnormality CT angiography head and neck was negative for LVO or other emergent finding. Echocardiogram in the morning MRI of brain without contrast in the morning Continue statin Continue  fall precautions and neuro checks Lipid panel and hemoglobin A1c will be checked Continue PT/SLP/OT eval and treat Bedside swallow eval by nursing prior to diet Tele neurology will be consulted and we shall await further recommendation  Hypokalemia K+ 3.3, this will be replenished  Essential hypertension  Antihypertensives PRN if Blood pressure is greater than 220/120 or there is a concern for End organ damage/contraindications for permissive HTN. If blood pressure is greater than 220/120 give labetalol PO or IV or Vasotec IV with a goal of 15% reduction in BP during the first 24 hours.  Mixed hyperlipidemia Continue Lipitor  GERD Continue Pepcid and Protonix  Obesity (BMI 30.54) Diet and lifestyle modification    DVT prophylaxis: SCDs  Code Status: Full  code  Consults: Neurology  Family Communication: Wife at bedside (all questions answered to satisfaction)  Severity of Illness: The appropriate patient status for this patient is OBSERVATION. Observation status is judged to be reasonable and necessary in order to provide the required intensity of service to ensure the patient's safety. The patient's presenting symptoms, physical exam findings, and initial radiographic and laboratory data in the context of their medical condition is felt to place them at decreased risk for further clinical deterioration. Furthermore, it is anticipated that the patient will be medically stable for discharge from the hospital within 2 midnights of admission.   Author: Bernadette Hoit, DO 04/14/2022 4:28 AM  For on call review [email or web address].

## 2022-04-13 NOTE — ED Notes (Signed)
Pt ambulatory to restroom

## 2022-04-13 NOTE — ED Notes (Signed)
Pt back from CT

## 2022-04-13 NOTE — ED Notes (Signed)
Pt independent, alert, oriented, &  ambulatory

## 2022-04-14 ENCOUNTER — Observation Stay (HOSPITAL_BASED_OUTPATIENT_CLINIC_OR_DEPARTMENT_OTHER): Payer: Medicare PPO

## 2022-04-14 ENCOUNTER — Other Ambulatory Visit: Payer: Self-pay | Admitting: Neurology

## 2022-04-14 ENCOUNTER — Other Ambulatory Visit (HOSPITAL_COMMUNITY): Payer: Self-pay | Admitting: *Deleted

## 2022-04-14 ENCOUNTER — Observation Stay (HOSPITAL_COMMUNITY): Payer: Medicare PPO

## 2022-04-14 DIAGNOSIS — E782 Mixed hyperlipidemia: Secondary | ICD-10-CM | POA: Diagnosis present

## 2022-04-14 DIAGNOSIS — R4182 Altered mental status, unspecified: Secondary | ICD-10-CM

## 2022-04-14 DIAGNOSIS — R410 Disorientation, unspecified: Secondary | ICD-10-CM

## 2022-04-14 DIAGNOSIS — E669 Obesity, unspecified: Secondary | ICD-10-CM | POA: Diagnosis present

## 2022-04-14 DIAGNOSIS — I10 Essential (primary) hypertension: Secondary | ICD-10-CM | POA: Diagnosis present

## 2022-04-14 DIAGNOSIS — K219 Gastro-esophageal reflux disease without esophagitis: Secondary | ICD-10-CM | POA: Insufficient documentation

## 2022-04-14 DIAGNOSIS — G459 Transient cerebral ischemic attack, unspecified: Secondary | ICD-10-CM

## 2022-04-14 DIAGNOSIS — R413 Other amnesia: Secondary | ICD-10-CM | POA: Diagnosis not present

## 2022-04-14 DIAGNOSIS — E876 Hypokalemia: Secondary | ICD-10-CM | POA: Diagnosis present

## 2022-04-14 LAB — COMPREHENSIVE METABOLIC PANEL
ALT: 29 U/L (ref 0–44)
AST: 26 U/L (ref 15–41)
Albumin: 3.8 g/dL (ref 3.5–5.0)
Alkaline Phosphatase: 48 U/L (ref 38–126)
Anion gap: 9 (ref 5–15)
BUN: 13 mg/dL (ref 8–23)
CO2: 27 mmol/L (ref 22–32)
Calcium: 9.1 mg/dL (ref 8.9–10.3)
Chloride: 102 mmol/L (ref 98–111)
Creatinine, Ser: 0.95 mg/dL (ref 0.61–1.24)
GFR, Estimated: >60 mL/min (ref >60)
Glucose, Bld: 101 mg/dL — ABNORMAL HIGH (ref 70–99)
Potassium: 3.3 mmol/L — ABNORMAL LOW (ref 3.5–5.1)
Sodium: 138 mmol/L (ref 135–145)
Total Bilirubin: 1.5 mg/dL — ABNORMAL HIGH (ref 0.3–1.2)
Total Protein: 7 g/dL (ref 6.5–8.1)

## 2022-04-14 LAB — CBC
HCT: 41.8 % (ref 39.0–52.0)
Hemoglobin: 14.2 g/dL (ref 13.0–17.0)
MCH: 30.2 pg (ref 26.0–34.0)
MCHC: 34 g/dL (ref 30.0–36.0)
MCV: 88.9 fL (ref 80.0–100.0)
Platelets: 179 10*3/uL (ref 150–400)
RBC: 4.7 MIL/uL (ref 4.22–5.81)
RDW: 13.3 % (ref 11.5–15.5)
WBC: 5.6 10*3/uL (ref 4.0–10.5)
nRBC: 0 % (ref 0.0–0.2)

## 2022-04-14 LAB — ECHOCARDIOGRAM COMPLETE
AR max vel: 2.01 cm2
AV Area VTI: 2.73 cm2
AV Area mean vel: 2.25 cm2
AV Mean grad: 4.7 mmHg
AV Peak grad: 10.8 mmHg
Ao pk vel: 1.64 m/s
Area-P 1/2: 2.62 cm2
Height: 68 in
S' Lateral: 3.1 cm
Weight: 3213.42 oz

## 2022-04-14 LAB — LIPID PANEL
Cholesterol: 140 mg/dL (ref 0–200)
HDL: 36 mg/dL — ABNORMAL LOW (ref >40)
LDL Cholesterol: 85 mg/dL (ref 0–99)
Total CHOL/HDL Ratio: 3.9 RATIO
Triglycerides: 94 mg/dL (ref <150)
VLDL: 19 mg/dL (ref 0–40)

## 2022-04-14 LAB — MAGNESIUM: Magnesium: 2.1 mg/dL (ref 1.7–2.4)

## 2022-04-14 LAB — HEMOGLOBIN A1C
Hgb A1c MFr Bld: 5.6 % (ref 4.8–5.6)
Mean Plasma Glucose: 114.02 mg/dL

## 2022-04-14 LAB — PHOSPHORUS: Phosphorus: 3.4 mg/dL (ref 2.5–4.6)

## 2022-04-14 MED ORDER — ROSUVASTATIN CALCIUM 20 MG PO TABS: 20.0000 mg | ORAL_TABLET | Freq: Every day | ORAL | 11 refills | Status: DC

## 2022-04-14 MED ORDER — ONDANSETRON HCL 4 MG PO TABS: 4.0000 mg | ORAL_TABLET | Freq: Four times a day (QID) | ORAL | Status: DC | PRN

## 2022-04-14 MED ORDER — PANTOPRAZOLE SODIUM 40 MG PO TBEC
40.0000 mg | DELAYED_RELEASE_TABLET | Freq: Every day | ORAL | Status: DC
  Administered 2022-04-14: 40 mg via ORAL
  Filled 2022-04-14: qty 1

## 2022-04-14 MED ORDER — FAMOTIDINE 20 MG PO TABS: 20.0000 mg | ORAL_TABLET | Freq: Every day | ORAL | Status: DC

## 2022-04-14 MED ORDER — ASPIRIN 81 MG PO TBEC: 81.0000 mg | DELAYED_RELEASE_TABLET | Freq: Every day | ORAL | 2 refills | Status: AC

## 2022-04-14 MED ORDER — ACETAMINOPHEN 650 MG RE SUPP: 650.0000 mg | Freq: Four times a day (QID) | RECTAL | Status: DC | PRN

## 2022-04-14 MED ORDER — ATORVASTATIN CALCIUM 20 MG PO TABS
20.0000 mg | ORAL_TABLET | Freq: Every day | ORAL | Status: DC
  Administered 2022-04-14: 20 mg via ORAL
  Filled 2022-04-14: qty 1

## 2022-04-14 MED ORDER — POTASSIUM CHLORIDE CRYS ER 20 MEQ PO TBCR
40.0000 meq | EXTENDED_RELEASE_TABLET | Freq: Once | ORAL | Status: AC
  Administered 2022-04-14: 40 meq via ORAL
  Filled 2022-04-14: qty 2

## 2022-04-14 MED ORDER — ONDANSETRON HCL 4 MG/2ML IJ SOLN: 4.0000 mg | Freq: Four times a day (QID) | INTRAMUSCULAR | Status: DC | PRN

## 2022-04-14 MED ORDER — ACETAMINOPHEN 325 MG PO TABS: 650.0000 mg | ORAL_TABLET | Freq: Four times a day (QID) | ORAL | Status: DC | PRN

## 2022-04-14 NOTE — Evaluation (Signed)
Physical Therapy Evaluation Patient Details Name: Adam Sherman MRN: [id number] DOB: Oct 15, 1945 Today's Date: 04/14/2022  History of Present Illness  Adam Sherman is a 76 y.o. male with medical history significant of hypertension, hyperlipidemia, GERD who presents to the emergency department due to sudden onset of memory loss.  Patient went to take a shower this afternoon around 3:10 PM after which he was unable to remember what he did after coming out of the shower.  He find himself at the dinner table downstairs, but did not remember how he got there, he had a conversation with his wife while sitting at the table, but patient did not remember.  Patient was reported to be oriented only to self and location, he was also noted with blank stare, gait was normal.  Patient endorsed regaining some of his memory back by the time he was in the ED.  He denies history of seizures or stroke. (Per DO.)   Clinical Impression  Patient functioning near baseline for functional mobility and gait, able to negotiate stairs and ambulate on level, inclined and declined surfaces with no AD or loss of balance. Patient left seated EOB after therapy with wife in room. Plan: patient discharged from physical therapy to care of nursing for ambulation daily as tolerated for length of stay.     Recommendations for follow up therapy are one component of a multi-disciplinary discharge planning process, led by the attending physician.  Recommendations may be updated based on patient status, additional functional criteria and insurance authorization.  Follow Up Recommendations No PT follow up      Assistance Recommended at Discharge PRN  Patient can return home with the following  Other (comment) (pt functioning at baseline)    Equipment Recommendations None recommended by PT  Recommendations for Other Services       Functional Status Assessment Patient has not had a recent decline in their functional status      Precautions / Restrictions Precautions Precautions: Fall Restrictions Weight Bearing Restrictions: No      Mobility  Bed Mobility Overal bed mobility: Independent                  Transfers Overall transfer level: Independent                      Ambulation/Gait Ambulation/Gait assistance: Independent Gait Distance (Feet): 175 Feet Assistive device: None Gait Pattern/deviations: WFL(Within Functional Limits) Gait velocity: normal     General Gait Details: patient demonstrates good return for stair negotiation and ambulation in hallway on level, declined and inclined surfaces with no loss of balance or AD  Stairs Stairs: Yes     Number of Stairs: 10 General stair comments: patient able to ascend 10 steps w/o use of rails and descend same flight of stairs with use of hand rail on left with no loss of balance or safety concerns  Wheelchair Mobility    Modified Rankin (Stroke Patients Only)       Balance Overall balance assessment: Mild deficits observed, not formally tested                                           Pertinent Vitals/Pain Pain Assessment Pain Assessment: No/denies pain    Home Living Family/patient expects to be discharged to:: Private residence Living Arrangements: Spouse/significant other Available Help at Discharge: Family;Available PRN/intermittently Type of  Home: House Home Access: Stairs to enter Entrance Stairs-Rails:  (grab bar on right side going up) Entrance Stairs-Number of Steps: 2 Alternate Level Stairs-Number of Steps: 12 Home Layout: Two level Home Equipment: Cane - quad;BSC/3in1;Other (comment) (shower stool)      Prior Function Prior Level of Function : Independent/Modified Independent             Mobility Comments: Community ambulator w/o AD, drives, works, plays golf regularly ADLs Comments: Independent     Hand Dominance   Dominant Hand: Right    Extremity/Trunk Assessment    Upper Extremity Assessment Upper Extremity Assessment: Overall WFL for tasks assessed    Lower Extremity Assessment Lower Extremity Assessment: Overall WFL for tasks assessed    Cervical / Trunk Assessment Cervical / Trunk Assessment: Normal  Communication   Communication: No difficulties  Cognition Arousal/Alertness: Awake/alert Behavior During Therapy: WFL for tasks assessed/performed Overall Cognitive Status: Within Functional Limits for tasks assessed                                          General Comments      Exercises     Assessment/Plan    PT Assessment Patient does not need any further PT services  PT Problem List         PT Treatment Interventions      PT Goals (Current goals can be found in the Care Plan section)  Acute Rehab PT Goals Patient Stated Goal: return home, play golf PT Goal Formulation: With patient/family Time For Goal Achievement: 04/14/22 Potential to Achieve Goals: Good    Frequency       Co-evaluation   Reason for Co-Treatment: To address functional/ADL transfers   OT goals addressed during session: ADL's and self-care       AM-PAC PT "6 Clicks" Mobility  Outcome Measure Help needed turning from your back to your side while in a flat bed without using bedrails?: None Help needed moving from lying on your back to sitting on the side of a flat bed without using bedrails?: None Help needed moving to and from a bed to a chair (including a wheelchair)?: None Help needed standing up from a chair using your arms (e.g., wheelchair or bedside chair)?: None Help needed to walk in hospital room?: None Help needed climbing 3-5 steps with a railing? : None 6 Click Score: 24    End of Session   Activity Tolerance: Patient tolerated treatment well Patient left: in bed;with family/visitor present;with call bell/phone within reach (left seated EOB with wife in room) Nurse Communication: Mobility status PT Visit  Diagnosis: Unsteadiness on feet (R26.81);Other abnormalities of gait and mobility (R26.89);Muscle weakness (generalized) (M62.81)    Time: 3382-5053 PT Time Calculation (min) (ACUTE ONLY): 20 min   Charges:   PT Evaluation $PT Eval Moderate Complexity: 1 Mod PT Treatments $Therapeutic Activity: 8-22 mins        Zigmund Gottron, SPT

## 2022-04-14 NOTE — Progress Notes (Signed)
Pt A/O x 4, responds appropriately to questions.  Modified NIH negative.  VSS.  No pain.  Ambulatory in room.  NSR on monitor.  Wife at bedside.

## 2022-04-14 NOTE — Discharge Summary (Signed)
Physician Discharge Summary  Adam Sherman ZOX:[id number] DOB: 02/24/1946 DOA: 04/13/2022  PCP: Deland Pretty, MD  Admit date: 04/13/2022  Discharge date: 04/14/2022  Admitted From:Home  Disposition:  Home  Recommendations for Outpatient Follow-up:  Follow up with PCP in 1-2 weeks Follow-up with neurology in 3 months with referral sent to [facility] Continue aspirin 81 mg daily as well as Crestor 20 mg daily as prescribed Continue other home medications as prior  Home Health: None  Equipment/Devices: None  Discharge Condition:Stable  CODE STATUS: Full  Diet recommendation: Heart Healthy  Brief/Interim Summary:  Adam Sherman is a 76 y.o. male with medical history significant of hypertension, hyperlipidemia, GERD who presents to the emergency department due to sudden onset of memory loss.  Patient went to take a shower this afternoon around 3:10 PM after which he was unable to remember what he did after coming out of the shower.  He was admitted with concern of transient amnesia/TIA and underwent further work-up as noted below with no significant findings noted.  He was seen by neurology with recommendations for aspirin 81 mg daily as well as Crestor 20 mg daily and has been recommended to follow-up with neurology in the next 3 months as noted above.  Discharge Diagnoses:  Principal Problem:   TIA (transient ischemic attack) Active Problems:   Altered mental status   Hypokalemia   Obesity (BMI 30-39.9)   Essential hypertension   Mixed hyperlipidemia   GERD (gastroesophageal reflux disease)  Principal discharge diagnosis: Transient alteration of awareness possible TIA vs Seizure.  Discharge Instructions  Discharge Instructions     Ambulatory referral to Neurology   Complete by: As directed    An appointment is requested in approximately: 12 weeks   Diet - low sodium heart healthy   Complete by: As directed    Increase activity slowly   Complete  by: As directed       Allergies as of 04/14/2022   No Known Allergies      Medication List     STOP taking these medications    atorvastatin 20 MG tablet Commonly known as: Lipitor       TAKE these medications    amLODipine 5 MG tablet Commonly known as: NORVASC Take 5 mg by mouth daily.   Arnicare Arthritis Tbdp Take by mouth.   aspirin EC 81 MG tablet Take 1 tablet (81 mg total) by mouth daily. Swallow whole.   famotidine 20 MG tablet Commonly known as: PEPCID TAKE ONE TABLET BY MOUTH AT BEDTIME AS NEEDED.   GLUCOSAMINE HCL-MSM PO Take 1 tablet by mouth daily.   ibuprofen 400 MG tablet Commonly known as: ADVIL Take 400 mg by mouth every 6 (six) hours as needed.   losartan-hydrochlorothiazide 100-25 MG tablet Commonly known as: HYZAAR Take 1 tablet by mouth daily.   omeprazole 40 MG capsule Commonly known as: PRILOSEC Take 30-60 min before first meal of the day   rosuvastatin 20 MG tablet Commonly known as: Crestor Take 1 tablet (20 mg total) by mouth daily.        Follow-up Information     Deland Pretty, MD. Schedule an appointment as soon as possible for a visit in 1 week(s).   Specialty: Internal Medicine Contact information: [address] [place] [address] [phone]         [place]. Schedule an appointment as soon as possible for a visit in 3 month(s).   Contact information: [address]  [address] [place] [address] [phone]               No Known Allergies  Consultations: Neurology   Procedures/Studies: MR BRAIN WO CONTRAST  Result Date: 04/14/2022 CLINICAL DATA:  Neuro deficit, acute, stroke suspected. Acute memory loss. EXAM: MRI HEAD WITHOUT CONTRAST TECHNIQUE: Multiplanar, multiecho pulse sequences of the brain and surrounding structures were obtained without intravenous contrast. COMPARISON:  Head CT and CTA 04/13/2022 FINDINGS: Brain:  There is no evidence of an acute infarct, intracranial hemorrhage, mass, midline shift, or extra-axial fluid collection. The ventricles and sulci are normal. Small T2 hyperintensities in the cerebral white matter bilaterally are nonspecific but compatible with mild chronic small vessel ischemic disease. Dedicated temporal lobe imaging demonstrates symmetric volume and normal signal of the hippocampi. Vascular: Major intracranial vascular flow voids are preserved. Skull and upper cervical spine: Unremarkable bone marrow signal. Sinuses/Orbits: Unremarkable orbits. Paranasal sinuses and mastoid air cells are clear. Other: None. IMPRESSION: 1. No acute intracranial abnormality. 2. Mild chronic small vessel ischemic disease. Electronically Signed   By: Logan Bores M.D.   On: 04/14/2022 15:51   ECHOCARDIOGRAM COMPLETE  Result Date: 04/14/2022    ECHOCARDIOGRAM REPORT   Patient Name:   Adam Sherman Date of Exam: 04/14/2022 Medical Rec #:  [id number]        Height:       68.0 in Accession #:    [id number]       Weight:       200.8 lb Date of Birth:  Dec 22, 1945        BSA:          2.047 m Patient Age:    72 years         BP:           132/99 mmHg Patient Gender: M                HR:           72 bpm. Exam Location:  [facility] Procedure: 2D Echo, Cardiac Doppler and Color Doppler Indications:    TIA G45.9  History:        Patient has no prior history of Echocardiogram examinations.                 TIA; Risk Factors:Hypertension and Dyslipidemia. GERD.  Sonographer:    Alvino Chapel RCS Referring Phys: [id number] OLADAPO ADEFESO IMPRESSIONS  1. Left ventricular ejection fraction, by estimation, is 60 to 65%. The left ventricle has normal function. The left ventricle has no regional wall motion abnormalities. Left ventricular diastolic parameters are consistent with Grade I diastolic dysfunction (impaired relaxation).  2. Right ventricular systolic function is normal. The right ventricular size is normal.  3. The mitral  valve is normal in structure. No evidence of mitral valve regurgitation. No evidence of mitral stenosis.  4. The aortic valve was not well visualized. Aortic valve regurgitation is not visualized. No aortic stenosis is present.  5. Aortic dilatation noted. There is mild dilatation of the aortic root, measuring 40 mm.  6. The inferior vena cava is normal in size with greater than 50% respiratory variability, suggesting right atrial pressure of 3 mmHg. FINDINGS  Left Ventricle: Left ventricular ejection fraction, by estimation, is 60 to 65%. The left ventricle has normal function. The left ventricle has no regional wall motion abnormalities. The left ventricular internal cavity size was normal in size. There is  no left ventricular hypertrophy. Left ventricular diastolic parameters  are consistent with Grade I diastolic dysfunction (impaired relaxation). Normal left ventricular filling pressure. Right Ventricle: The right ventricular size is normal. Right vetricular wall thickness was not well visualized. Right ventricular systolic function is normal. Left Atrium: Left atrial size was normal in size. Right Atrium: Right atrial size was normal in size. Pericardium: There is no evidence of pericardial effusion. Mitral Valve: The mitral valve is normal in structure. No evidence of mitral valve regurgitation. No evidence of mitral valve stenosis. Tricuspid Valve: The tricuspid valve is normal in structure. Tricuspid valve regurgitation is not demonstrated. No evidence of tricuspid stenosis. Aortic Valve: The aortic valve was not well visualized. Aortic valve regurgitation is not visualized. No aortic stenosis is present. Aortic valve mean gradient measures 4.7 mmHg. Aortic valve peak gradient measures 10.8 mmHg. Aortic valve area, by VTI measures 2.73 cm. Pulmonic Valve: The pulmonic valve was not well visualized. Pulmonic valve regurgitation is not visualized. No evidence of pulmonic stenosis. Aorta: Aortic dilatation  noted. There is mild dilatation of the aortic root, measuring 40 mm. Venous: The inferior vena cava is normal in size with greater than 50% respiratory variability, suggesting right atrial pressure of 3 mmHg. IAS/Shunts: No atrial level shunt detected by color flow Doppler.  LEFT VENTRICLE PLAX 2D LVIDd:         4.70 cm   Diastology LVIDs:         3.10 cm   LV e' medial:    4.90 cm/s LV PW:         1.00 cm   LV E/e' medial:  13.5 LV IVS:        1.00 cm   LV e' lateral:   7.72 cm/s LVOT diam:     2.00 cm   LV E/e' lateral: 8.5 LV SV:         70 LV SV Index:   34 LVOT Area:     3.14 cm  RIGHT VENTRICLE RV S prime:     17.80 cm/s TAPSE (M-mode): 2.3 cm LEFT ATRIUM             Index        RIGHT ATRIUM           Index LA diam:        3.30 cm 1.61 cm/m   RA Area:     19.80 cm LA Vol (A2C):   71.1 ml 34.73 ml/m  RA Volume:   58.30 ml  28.48 ml/m LA Vol (A4C):   54.1 ml 26.42 ml/m LA Biplane Vol: 63.3 ml 30.92 ml/m  AORTIC VALVE AV Area (Vmax):    2.01 cm AV Area (Vmean):   2.25 cm AV Area (VTI):     2.73 cm AV Vmax:           164.24 cm/s AV Vmean:          98.639 cm/s AV VTI:            0.257 m AV Peak Grad:      10.8 mmHg AV Mean Grad:      4.7 mmHg LVOT Vmax:         105.00 cm/s LVOT Vmean:        70.700 cm/s LVOT VTI:          0.223 m LVOT/AV VTI ratio: 0.87  AORTA Ao Root diam: 4.00 cm MITRAL VALVE MV Area (PHT): 2.62 cm    SHUNTS MV Decel Time: 289 msec    Systemic VTI:  0.22 m  MV E velocity: 66.00 cm/s  Systemic Diam: 2.00 cm MV A velocity: 85.20 cm/s MV E/A ratio:  0.77 Carlyle Dolly MD Electronically signed by Carlyle Dolly MD Signature Date/Time: 04/14/2022/11:09:25 AM    Final    CT ANGIO HEAD NECK W WO CM  Result Date: 04/13/2022 CLINICAL DATA:  Initial evaluation for neuro deficit, stroke suspected. EXAM: CT ANGIOGRAPHY HEAD AND NECK TECHNIQUE: Multidetector CT imaging of the head and neck was performed using the standard protocol during bolus administration of intravenous contrast.  Multiplanar CT image reconstructions and MIPs were obtained to evaluate the vascular anatomy. Carotid stenosis measurements (when applicable) are obtained utilizing NASCET criteria, using the distal internal carotid diameter as the denominator. RADIATION DOSE REDUCTION: This exam was performed according to the departmental dose-optimization program which includes automated exposure control, adjustment of the mA and/or kV according to patient size and/or use of iterative reconstruction technique. CONTRAST:  59m OMNIPAQUE IOHEXOL 350 MG/ML SOLN COMPARISON:  CT from earlier the same day. FINDINGS: CTA NECK FINDINGS Aortic arch: Visualized aortic arch normal caliber with standard branch pattern. No significant stenosis about the origin of the great vessels. Right carotid system: The right common and internal carotid arteries are diffusely tortuous without dissection or occlusion. Atheromatous change about the right carotid bulb without hemodynamically significant greater than 50% stenosis. Left carotid system: Left common and internal carotid arteries are diffusely tortuous without dissection or occlusion. Mild atheromatous change about the left carotid bulb without hemodynamically significant greater than 50% stenosis. Vertebral arteries: Both vertebral arteries arise from subclavian arteries. No proximal subclavian artery stenosis. Right vertebral artery dominant. Vertebral arteries are tortuous without stenosis, dissection or occlusion. Skeleton: Mild-to-moderate multilevel cervical spondylosis. No convincing worrisome osseous lesions. Other neck: No other acute soft tissue abnormality within the neck. 9 mm right thyroid nodule, of doubtful significance given size and patient age, no follow-up imaging recommended (ref: J Am Coll Radiol. 2015 Feb;12(2): 143-50). Upper chest: Visualized upper chest demonstrates no acute finding. Review of the MIP images confirms the above findings CTA HEAD FINDINGS Anterior  circulation: Both internal carotid arteries are somewhat ectatic and widely patent to the termini without stenosis. A1 segments, anterior communicating complex common anterior cerebral arteries widely patent. No M1 stenosis or occlusion. No proximal MCA branch occlusion or stenosis. Distal MCA branches perfused and symmetric. Posterior circulation: Dominant right V4 segment widely patent. Atheromatous plaque at the mid left V4 segment with associated short-segment moderate stenosis (series 6, image 279). Both PICA patent. Basilar somewhat ectatic without stenosis. Superior cerebellar and posterior cerebral arteries patent bilaterally. Venous sinuses: Patent allowing for timing the contrast bolus. Anatomic variants: None significant.  No aneurysm. Review of the MIP images confirms the above findings IMPRESSION: 1. Negative CTA for large vessel occlusion or other emergent finding. 2. Atheromatous plaque at the mid left V4 segment with associated short-segment moderate stenosis. 3. Diffuse tortuosity of the major arterial vasculature of the head and neck, suggesting chronic underlying hypertension. Electronically Signed   By: BJeannine BogaM.D.   On: 04/13/2022 22:46   CT HEAD WO CONTRAST  Result Date: 04/13/2022 CLINICAL DATA:  Mental status change EXAM: CT HEAD WITHOUT CONTRAST TECHNIQUE: Contiguous axial images were obtained from the base of the skull through the vertex without intravenous contrast. RADIATION DOSE REDUCTION: This exam was performed according to the departmental dose-optimization program which includes automated exposure control, adjustment of the mA and/or kV according to patient size and/or use of iterative reconstruction technique. COMPARISON:  None Available. FINDINGS:  Brain: No evidence of acute infarction, hemorrhage, hydrocephalus, extra-axial collection or mass lesion/mass effect. Vascular: No hyperdense vessel or unexpected calcification. Skull: Normal. Negative for fracture or  focal lesion. Sinuses/Orbits: No acute finding. Other: None. IMPRESSION: No acute intracranial abnormality. Electronically Signed   By: Ronney Asters M.D.   On: 04/13/2022 18:38     Discharge Exam: Vitals:   04/14/22 0324 04/14/22 0512  BP: 118/76 (!) 132/99  Pulse: 65 72  Resp: 16   Temp: 98.5 F (36.9 C)   SpO2: 96% 97%   Vitals:   04/14/22 0000 04/14/22 0117 04/14/22 0324 04/14/22 0512  BP: 127/87 130/87 118/76 (!) 132/99  Pulse: 62 63 65 72  Resp: '16 18 16   '$ Temp:  97.6 F (36.4 C) 98.5 F (36.9 C)   TempSrc:  Oral Oral   SpO2: 96% 97% 96% 97%  Weight:  91.1 kg    Height:  '5\' 8"'$  (1.727 m)      General: Pt is alert, awake, not in acute distress Cardiovascular: RRR, S1/S2 +, no rubs, no gallops Respiratory: CTA bilaterally, no wheezing, no rhonchi Abdominal: Soft, NT, ND, bowel sounds + Extremities: no edema, no cyanosis    The results of significant diagnostics from this hospitalization (including imaging, microbiology, ancillary and laboratory) are listed below for reference.     Microbiology: No results found for this or any previous visit (from the past 240 hour(s)).   Labs: BNP (last 3 results) No results for input(s): "BNP" in the last 8760 hours. Basic Metabolic Panel: Recent Labs  Lab 04/13/22 1824 04/14/22 0449  NA 135 138  K 3.3* 3.3*  CL 100 102  CO2 28 27  GLUCOSE 132* 101*  BUN 13 13  CREATININE 1.20 0.95  CALCIUM 8.9 9.1  MG  --  2.1  PHOS  --  3.4   Liver Function Tests: Recent Labs  Lab 04/13/22 1824 04/14/22 0449  AST 32 26  ALT 34 29  ALKPHOS 54 48  BILITOT 0.8 1.5*  PROT 7.6 7.0  ALBUMIN 4.1 3.8   No results for input(s): "LIPASE", "AMYLASE" in the last 168 hours. No results for input(s): "AMMONIA" in the last 168 hours. CBC: Recent Labs  Lab 04/13/22 1824 04/14/22 0449  WBC 7.0 5.6  NEUTROABS 4.6  --   HGB 14.3 14.2  HCT 42.2 41.8  MCV 89.8 88.9  PLT 191 179   Cardiac Enzymes: No results for input(s):  "CKTOTAL", "CKMB", "CKMBINDEX", "TROPONINI" in the last 168 hours. BNP: Invalid input(s): "POCBNP" CBG: Recent Labs  Lab 04/13/22 1808  GLUCAP 167*   D-Dimer No results for input(s): "DDIMER" in the last 72 hours. Hgb A1c Recent Labs    04/14/22 0449  HGBA1C 5.6   Lipid Profile Recent Labs    04/14/22 0449  CHOL 140  HDL 36*  LDLCALC 85  TRIG 94  CHOLHDL 3.9   Thyroid function studies No results for input(s): "TSH", "T4TOTAL", "T3FREE", "THYROIDAB" in the last 72 hours.  Invalid input(s): "FREET3" Anemia work up No results for input(s): "VITAMINB12", "FOLATE", "FERRITIN", "TIBC", "IRON", "RETICCTPCT" in the last 72 hours. Urinalysis    Component Value Date/Time   COLORURINE COLORLESS (A) 04/13/2022 1851   APPEARANCEUR CLEAR 04/13/2022 1851   LABSPEC 1.002 (L) 04/13/2022 1851   PHURINE 7.0 04/13/2022 1851   GLUCOSEU NEGATIVE 04/13/2022 1851   HGBUR NEGATIVE 04/13/2022 1851   BILIRUBINUR NEGATIVE 04/13/2022 1851   KETONESUR NEGATIVE 04/13/2022 1851   PROTEINUR NEGATIVE 04/13/2022 1851   NITRITE  NEGATIVE 04/13/2022 [place] 04/13/2022 1851   Sepsis Labs Recent Labs  Lab 04/13/22 1824 04/14/22 0449  WBC 7.0 5.6   Microbiology No results found for this or any previous visit (from the past 240 hour(s)).   Time coordinating discharge: 35 minutes  SIGNED:   Rodena Goldmann, DO [facility] 04/14/2022, 4:18 PM  If 7PM-7AM, please contact night-coverage [email or web address]

## 2022-04-14 NOTE — Progress Notes (Signed)
*  PRELIMINARY RESULTS* Echocardiogram 2D Echocardiogram has been performed.  Adam Sherman 04/14/2022, 10:27 AM

## 2022-04-14 NOTE — TOC Progression Note (Signed)
  Transition of Care [facility]) Screening Note   Patient Details  Name: Adam Sherman Date of Birth: 08-02-1945   Transition of Care [facility]) CM/SW Contact:    Boneta Lucks, RN Phone Number: 04/14/2022, 10:46 AM  PT - NO Follow up needed.  Transition of Care Department [facility]) has reviewed patient and no TOC needs have been identified at this time. We will continue to monitor patient advancement through interdisciplinary progression rounds. If new patient transition needs arise, please place a TOC consult.    Expected Discharge Plan: Home/Self Care Barriers to Discharge: Continued Medical Work up  Expected Discharge Plan and Services Expected Discharge Plan: Home/Self Care       Living arrangements for the past 2 months: [place]

## 2022-04-14 NOTE — Consult Note (Addendum)
I connected with  Adam Sherman on 04/14/22 by a video enabled telemedicine application and verified that I am speaking with the correct person using two identifiers.   I discussed the limitations of evaluation and management by telemedicine. The patient expressed understanding and agreed to proceed.  Location of patient: [facility] Location of physician: [facility]   Neurology Consultation Reason for Consult: ams Referring Physician: Dr Nicanor Bake  CC: ams  History is obtained from: patient, chart review  HPI: Adam Sherman is a 76 y.o. male with past medical history of hypertension, 4.4 cm fusiform ascending aortic aneurysm who presented with transient confusion.  Patient states he mowed his yard yesterday and went to shower around 3:00.  After that he got dressed.  His wife came home around 4:00 and noticed that he was behaving differently, repeatedly looking his clothes, did not remember getting dressed and did not remember that they were supposed to go out for dinner.  They called EMS and was brought to [facility], ED.  His symptoms were already starting to get better by then.  Patient denies any similar symptoms in the past.  Denies any new medications, recent illness.  LKN 04/13/2022 ~ 1530 Event happened at home No tpa as outside [place] No thrombectomy as no LVO mRS 0  ROS: All other systems reviewed and negative except as noted in the HPI.   Past Medical History:  Diagnosis Date   Arthritis    thumb   Asthma    Cataract    GERD (gastroesophageal reflux disease)    History of kidney stones    Hypertension    Squamous cell carcinoma of skin 08/05/2018   right dorsum of hand    Family History  Problem Relation Age of Onset   Colon cancer Neg Hx    Stomach cancer Neg Hx    Colon polyps Neg Hx    Esophageal cancer Neg Hx    Rectal cancer Neg Hx    Social History:  reports that he quit smoking about 51 years ago. His smoking use  included cigarettes. He has a 0.30 pack-year smoking history. He has never used smokeless tobacco. He reports that he does not drink alcohol and does not use drugs.   Medications Prior to Admission  Medication Sig Dispense Refill Last Dose   amLODipine (NORVASC) 5 MG tablet Take 5 mg by mouth daily.   04/13/2022   famotidine (PEPCID) 20 MG tablet TAKE ONE TABLET BY MOUTH AT BEDTIME AS NEEDED. 90 tablet 3 04/12/2022   GLUCOSAMINE HCL-MSM PO Take 1 tablet by mouth daily.   04/13/2022   Homeopathic Products (ARNICARE ARTHRITIS) TBDP Take by mouth.   unknown   ibuprofen (ADVIL) 400 MG tablet Take 400 mg by mouth every 6 (six) hours as needed.   unknown   losartan-hydrochlorothiazide (HYZAAR) 100-25 MG per tablet Take 1 tablet by mouth daily.   04/13/2022   omeprazole (PRILOSEC) 40 MG capsule Take 30-60 min before first meal of the day 30 capsule 2 04/13/2022   atorvastatin (LIPITOR) 20 MG tablet Take 1 tablet (20 mg total) by mouth daily. (Patient not taking: Reported on 04/13/2022) 30 tablet 4 Not Taking     Exam: Current vital signs: BP (!) 132/99 (BP Location: Right Arm)   Pulse 72   Temp 98.5 F (36.9 C) (Oral)   Resp 16   Ht '5\' 8"'$  (1.727 m)   Wt 91.1 kg   SpO2 97%   BMI 30.54  kg/m  Vital signs in last 24 hours: Temp:  [97.5 F (36.4 C)-98.5 F (36.9 C)] 98.5 F (36.9 C) (11/03 0324) Pulse Rate:  [59-79] 72 (11/03 0512) Resp:  [16-20] 16 (11/03 0324) BP: (118-150)/(76-99) 132/99 (11/03 0512) SpO2:  [96 %-99 %] 97 % (11/03 0512) Weight:  [89.4 kg-91.1 kg] 91.1 kg (11/03 0117)   Physical Exam  Constitutional: Appears well-developed and well-nourished.  Psych: Affect appropriate to situation Neuro: AOx3, cranial nerves II to XII appear grossly intact, antigravity strength without drift in all 4 extremities, sensation intact to light touch  NIHSS 0  I have reviewed labs in epic and the results pertinent to this consultation are: CBC:  Recent Labs  Lab 04/13/22 1824  04/14/22 0449  WBC 7.0 5.6  NEUTROABS 4.6  --   HGB 14.3 14.2  HCT 42.2 41.8  MCV 89.8 88.9  PLT 191 924    Basic Metabolic Panel:  Lab Results  Component Value Date   NA 138 04/14/2022   K 3.3 (L) 04/14/2022   CO2 27 04/14/2022   GLUCOSE 101 (H) 04/14/2022   BUN 13 04/14/2022   CREATININE 0.95 04/14/2022   CALCIUM 9.1 04/14/2022   GFRNONAA >60 04/14/2022   GFRAA >60 01/27/2020   Lipid Panel:  Lab Results  Component Value Date   LDLCALC 85 04/14/2022   HgbA1c:  Lab Results  Component Value Date   HGBA1C 5.6 04/14/2022   Urine Drug Screen: No results found for: "LABOPIA", "COCAINSCRNUR", "LABBENZ", "AMPHETMU", "THCU", "LABBARB"  Alcohol Level     Component Value Date/Time   ETH <10 04/13/2022 1824    I have reviewed the images obtained:  CT head without contrast 04/13/2022: No acute intracranial abnormality.   CT head and neck with and without contrast 04/13/2022: 1. Negative CTA for large vessel occlusion or other emergent finding.  2. Atheromatous plaque at the mid left V4 segment with associated  short-segment moderate stenosis. 3. Diffuse tortuosity of the major arterial vasculature of the head and neck, suggesting chronic underlying hypertension.   TTE 04/14/2022: Left ventricular ejection fraction 60 to 65%, no regional wall motion abnormalities, no thrombus/vegetations, no PFO   ASSESSMENT/PLAN: 43 old male who presented with transient altered mental status  Transient alteration of awareness - ddx include MRI neg stroke vs less likely sz ( duration of over 45 mins, no other provoking factors, no h/o seizures)  Recommendations: -Recommend aspirin 81 mg daily and rosuvastatin 20 mg daily for stroke prevention - Patient would really like to be discharged. Therefore can obtain eeg as outpatient -Goal blood pressure: Normotension -Modification of stroke risk factors -Stroke education including BEFAST -Recommend follow-up with neurology in 3  months   Thank you for allowing [place] to participate in the care of this patient. If you have any further questions, please contact  me or neurohospitalist.   Zeb Comfort Epilepsy [facility] neurohospitalist

## 2022-04-14 NOTE — Evaluation (Signed)
Occupational Therapy Evaluation Patient Details Name: Adam Sherman MRN: [id number] DOB: [birth year] Today's Date: 04/14/2022   History of Present Illness Adam Sherman is a 76 y.o. male with medical history significant of hypertension, hyperlipidemia, GERD who presents to the emergency department due to sudden onset of memory loss.  Patient went to take a shower this afternoon around 3:10 PM after which he was unable to remember what he did after coming out of the shower.  He find himself at the dinner table downstairs, but did not remember how he got there, he had a conversation with his wife while sitting at the table, but patient did not remember.  Patient was reported to be oriented only to self and location, he was also noted with blank stare, gait was normal.  Patient endorsed regaining some of his memory back by the time he was in the ED.  He denies history of seizures or stroke. (Per DO.)   Clinical Impression   Pt agreeable to OT and PT co-evaluation. Pt appears to be at or near baseline levels for ADL's and functional mobility. Pt reports feeling back to normal. Pt able to ambulate and don shoes without any difficulty. Pt is not recommended for further acute OT services and will be discharged to care of nursing staff for remaining length of stay.       Recommendations for follow up therapy are one component of a multi-disciplinary discharge planning process, led by the attending physician.  Recommendations may be updated based on patient status, additional functional criteria and insurance authorization.   Follow Up Recommendations  No OT follow up    Assistance Recommended at Discharge None        Functional Status Assessment  Patient has not had a recent decline in their functional status  Equipment Recommendations  None recommended by OT    Recommendations for Other Services       Precautions / Restrictions Precautions Precautions: Fall Restrictions Weight Bearing  Restrictions: No      Mobility Bed Mobility Overal bed mobility: Independent                  Transfers Overall transfer level: Independent                        Balance Overall balance assessment: Independent                                         ADL either performed or assessed with clinical judgement   ADL Overall ADL's : Independent                                             Vision Baseline Vision/History: 1 Wears glasses Ability to See in Adequate Light: 0 Adequate Patient Visual Report: No change from baseline Vision Assessment?: No apparent visual deficits                Pertinent Vitals/Pain Pain Assessment Pain Assessment: No/denies pain     Hand Dominance Right   Extremity/Trunk Assessment Upper Extremity Assessment Upper Extremity Assessment: Overall WFL for tasks assessed   Lower Extremity Assessment Lower Extremity Assessment: Defer to PT evaluation   Cervical / Trunk Assessment Cervical / Trunk Assessment: Normal   Communication  Communication Communication: No difficulties   Cognition Arousal/Alertness: Awake/alert Behavior During Therapy: WFL for tasks assessed/performed Overall Cognitive Status: Within Functional Limits for tasks assessed                                                        Home Living Family/patient expects to be discharged to:: Private residence Living Arrangements: Spouse/significant other Available Help at Discharge: Family;Available PRN/intermittently Type of Home: House Home Access: Stairs to enter Entrance Stairs-Number of Steps: 2 Entrance Stairs-Rails:  (single pillar/grab bar on R side going up) Home Layout: Two level Alternate Level Stairs-Number of Steps: 12 to 13 Alternate Level Stairs-Rails: Left (going up) Bathroom Shower/Tub: Teacher, early years/pre: Handicapped height Bathroom Accessibility: Yes How  Accessible: Accessible via walker Home Equipment: Cane - quad;BSC/3in1;Other (comment) (shower stool)          Prior Functioning/Environment Prior Level of Function : Independent/Modified Independent             Mobility Comments: Community ambulator ; no AD ADLs Comments: Independent                      OT Goals(Current goals can be found in the care plan section) Acute Rehab OT Goals Patient Stated Goal: return home  OT Frequency:      Co-evaluation PT/OT/SLP Co-Evaluation/Treatment: Yes Reason for Co-Treatment: To address functional/ADL transfers   OT goals addressed during session: ADL's and self-care      AM-PAC OT "6 Clicks" Daily Activity     Outcome Measure Help from another person eating meals?: None Help from another person taking care of personal grooming?: None Help from another person toileting, which includes using toliet, bedpan, or urinal?: None Help from another person bathing (including washing, rinsing, drying)?: None Help from another person to put on and taking off regular upper body clothing?: None Help from another person to put on and taking off regular lower body clothing?: None 6 Click Score: 24   End of Session    Activity Tolerance: Patient tolerated treatment well Patient left: in bed;with call bell/phone within reach;with family/visitor present  OT Visit Diagnosis: Other symptoms and signs involving cognitive function                Time: 8916-9450 OT Time Calculation (min): 9 min Charges:  OT General Charges $OT Visit: 1 Visit OT Evaluation $OT Eval Low Complexity: 1 Low  Iosefa Weintraub OT, MOT  Larey Seat 04/14/2022, 9:36 AM

## 2022-04-14 NOTE — Progress Notes (Signed)
SLP Cancellation Note  Patient Details Name: JIHAAD BRUSCHI MRN: [id number] DOB: May 06, 1946   Cancelled treatment:       Reason Eval/Treat Not Completed: SLP screened, no needs identified, will sign off. Pt passed [employer]. Our service will sign off at this time.  Savaughn Karwowski H. Roddie Mc, CCC-SLP Speech Language Pathologist    Wende Bushy 04/14/2022, 9:21 AM

## 2022-04-15 LAB — POCT I-STAT, CHEM 8
BUN: 13 mg/dL (ref 8–23)
Calcium, Ion: 1.18 mmol/L (ref 1.15–1.40)
Chloride: 97 mmol/L — ABNORMAL LOW (ref 98–111)
Creatinine, Ser: 1.1 mg/dL (ref 0.61–1.24)
Glucose, Bld: 139 mg/dL — ABNORMAL HIGH (ref 70–99)
HCT: 45 % (ref 39.0–52.0)
Hemoglobin: 15.3 g/dL (ref 13.0–17.0)
Potassium: 3.5 mmol/L (ref 3.5–5.1)
Sodium: 137 mmol/L (ref 135–145)
TCO2: 33 mmol/L — ABNORMAL HIGH (ref 22–32)

## 2022-04-17 DIAGNOSIS — E789 Disorder of lipoprotein metabolism, unspecified: Secondary | ICD-10-CM | POA: Diagnosis not present

## 2022-04-17 DIAGNOSIS — E876 Hypokalemia: Secondary | ICD-10-CM | POA: Diagnosis not present

## 2022-04-17 DIAGNOSIS — Z0000 Encounter for general adult medical examination without abnormal findings: Secondary | ICD-10-CM | POA: Diagnosis not present

## 2022-04-17 DIAGNOSIS — I6523 Occlusion and stenosis of bilateral carotid arteries: Secondary | ICD-10-CM | POA: Diagnosis not present

## 2022-04-17 DIAGNOSIS — I7121 Aneurysm of the ascending aorta, without rupture: Secondary | ICD-10-CM | POA: Diagnosis not present

## 2022-04-17 DIAGNOSIS — I10 Essential (primary) hypertension: Secondary | ICD-10-CM | POA: Diagnosis not present

## 2022-04-17 DIAGNOSIS — Z09 Encounter for follow-up examination after completed treatment for conditions other than malignant neoplasm: Secondary | ICD-10-CM | POA: Diagnosis not present

## 2022-04-17 DIAGNOSIS — R410 Disorientation, unspecified: Secondary | ICD-10-CM | POA: Diagnosis not present

## 2022-10-04 ENCOUNTER — Ambulatory Visit: Payer: Medicare PPO | Admitting: Dermatology

## 2022-10-04 VITALS — BP 148/89 | HR 76

## 2022-10-04 DIAGNOSIS — L570 Actinic keratosis: Secondary | ICD-10-CM | POA: Diagnosis not present

## 2022-10-04 DIAGNOSIS — L578 Other skin changes due to chronic exposure to nonionizing radiation: Secondary | ICD-10-CM | POA: Diagnosis not present

## 2022-10-04 DIAGNOSIS — Z79899 Other long term (current) drug therapy: Secondary | ICD-10-CM

## 2022-10-04 DIAGNOSIS — Z7189 Other specified counseling: Secondary | ICD-10-CM

## 2022-10-04 DIAGNOSIS — Z5111 Encounter for antineoplastic chemotherapy: Secondary | ICD-10-CM

## 2022-10-04 DIAGNOSIS — L821 Other seborrheic keratosis: Secondary | ICD-10-CM | POA: Diagnosis not present

## 2022-10-04 DIAGNOSIS — W908XXA Exposure to other nonionizing radiation, initial encounter: Secondary | ICD-10-CM | POA: Diagnosis not present

## 2022-10-04 NOTE — Patient Instructions (Addendum)
Instructions for [facility] Medications  One or more of your medications was sent to the [facility] mail order compounding pharmacy. You will receive an email from them and can purchase the medicine through that link. It will then be mailed to your home at the address you confirmed. If for any reason you do not receive an email from them, please check your spam folder. If you still do not find the email, please let us know. [facility] phone number is [phone].      5-Fluorouracil/Calcipotriene Patient Education  In 1 month begin using on the scalp and cheeks twice a day for 7 days  Actinic keratoses are the dry, red scaly spots on the skin caused by sun damage. A portion of these spots can turn into skin cancer with time, and treating them can help prevent development of skin cancer.   Treatment of these spots requires removal of the defective skin cells. There are various ways to remove actinic keratoses, including freezing with liquid nitrogen, treatment with creams, or treatment with a blue light procedure in the office.   5-fluorouracil cream is a topical cream used to treat actinic keratoses. It works by interfering with the growth of abnormal fast-growing skin cells, such as actinic keratoses. These cells peel off and are replaced by healthy ones.   5-fluorouracil/calcipotriene is a combination of the 5-fluorouracil cream with a vitamin D analog cream called calcipotriene. The calcipotriene alone does not treat actinic keratoses. However, when it is combined with 5-fluorouracil, it helps the 5-fluorouracil treat the actinic keratoses much faster so that the same results can be achieved with a much shorter treatment time.  INSTRUCTIONS FOR 5-FLUOROURACIL/CALCIPOTRIENE CREAM:   5-fluorouracil/calcipotriene cream typically only needs to be used for 4-7 days. A thin layer should be applied twice a day to the treatment areas recommended by your physician.   If your physician  prescribed you separate tubes of 5-fluourouracil and calcipotriene, apply a thin layer of 5-fluorouracil followed by a thin layer of calcipotriene.   Avoid contact with your eyes, nostrils, and mouth. Do not use 5-fluorouracil/calcipotriene cream on infected or open wounds.   You will develop redness, irritation and some crusting at areas where you have pre-cancer damage/actinic keratoses. IF YOU DEVELOP PAIN, BLEEDING, OR SIGNIFICANT CRUSTING, STOP THE TREATMENT EARLY - you have already gotten a good response and the actinic keratoses should clear up well.  Wash your hands after applying 5-fluorouracil 5% cream on your skin.   A moisturizer or sunscreen with a minimum SPF 30 should be applied each morning.   Once you have finished the treatment, you can apply a thin layer of Vaseline twice a day to irritated areas to soothe and calm the areas more quickly. If you experience significant discomfort, contact your physician.  For some patients it is necessary to repeat the treatment for best results.  SIDE EFFECTS: When using 5-fluorouracil/calcipotriene cream, you may have mild irritation, such as redness, dryness, swelling, or a mild burning sensation. This usually resolves within 2 weeks. The more actinic keratoses you have, the more redness and inflammation you can expect during treatment. Eye irritation has been reported rarely. If this occurs, please let us know.  If you have any trouble using this cream, please call the office. If you have any other questions about this information, please do not hesitate to ask me before you leave the office.      Cryotherapy Aftercare  Wash gently with soap and water everyday.   Apply  Vaseline and Band-Aid daily until healed.      Due to recent changes in healthcare laws, you may see results of your pathology and/or laboratory studies on MyChart before the doctors have had a chance to review them. We understand that in some cases there may be  results that are confusing or concerning to you. Please understand that not all results are received at the same time and often the doctors may need to interpret multiple results in order to provide you with the best plan of care or course of treatment. Therefore, we ask that you please give [place] 2 business days to thoroughly review all your results before contacting the office for clarification. Should we see a critical lab result, you will be contacted sooner.   If You Need Anything After Your Visit  If you have any questions or concerns for your doctor, please call our main line at [phone] and press option 4 to reach your doctor's medical assistant. If no one answers, please leave a voicemail as directed and we will return your call as soon as possible. Messages left after 4 pm will be answered the following business day.   You may also send [place] a message via MyChart. We typically respond to MyChart messages within 1-2 business days.  For prescription refills, please ask your pharmacy to contact our office. Our fax number is [phone].  If you have an urgent issue when the clinic is closed that cannot wait until the next business day, you can page your doctor at the number below.    Please note that while we do our best to be available for urgent issues outside of office hours, we are not available 24/7.   If you have an urgent issue and are unable to reach [place], you may choose to seek medical care at your doctor's office, retail clinic, urgent care center, or emergency room.  If you have a medical emergency, please immediately call 911 or go to the emergency department.  Pager Numbers  - Dr. Gwen Pounds: [phone]  - Dr. Neale Burly: [phone]  - Dr. Roseanne Reno: [phone]  In the event of inclement weather, please call our main line at [phone] for an update on the status of any delays or closures.  Dermatology Medication Tips: Please keep the boxes that topical medications come  in in order to help keep track of the instructions about where and how to use these. Pharmacies typically print the medication instructions only on the boxes and not directly on the medication tubes.   If your medication is too expensive, please contact our office at [phone] option 4 or send [place] a message through MyChart.   We are unable to tell what your co-pay for medications will be in advance as this is different depending on your insurance coverage. However, we may be able to find a substitute medication at lower cost or fill out paperwork to get insurance to cover a needed medication.   If a prior authorization is required to get your medication covered by your insurance company, please allow [place] 1-2 business days to complete this process.  Drug prices often vary depending on where the prescription is filled and some pharmacies may offer cheaper prices.  The website www.goodrx.com contains coupons for medications through different pharmacies. The prices here do not account for what the cost may be with help from insurance (it may be cheaper with your insurance), but the website can give you the price if you did not use any insurance.  -  You can print the associated coupon and take it with your prescription to the pharmacy.  - You may also stop by our office during regular business hours and pick up a GoodRx coupon card.  - If you need your prescription sent electronically to a different pharmacy, notify our office through [facility] or by phone at [phone] option 4.     Si Usted Necesita Algo Despus de Su Visita  Tambin puede enviarnos un mensaje a travs de Clinical cytogeneticist. Por lo general respondemos a los mensajes de MyChart en el transcurso de 1 a 2 das hbiles.  Para renovar recetas, por favor pida a su farmacia que se ponga en contacto con nuestra oficina. Annie Sable de fax es [place] [phone].  Si tiene un asunto urgente cuando la clnica est cerrada y que no puede  esperar hasta el siguiente da hbil, puede llamar/localizar a su doctor(a) al nmero que aparece a continuacin.   Por favor, tenga en cuenta que aunque hacemos todo lo posible para estar disponibles para asuntos urgentes fuera del horario de [place], no estamos disponibles las 24 horas del da, los 7 [address]  [address] de la [place].   Si tiene un problema urgente y no puede comunicarse con nosotros, puede optar por buscar atencin mdica  en el consultorio de su doctor(a), en una clnica privada, en un centro de atencin urgente o en una sala de emergencias.  Si tiene Engineer, drilling, por favor llame inmediatamente al 911 o vaya a la sala de emergencias.  Nmeros de bper  - Dr. Gwen Pounds: [phone]  - Dra. Moye: [phone]  - Dra. Roseanne Reno: [phone]  En caso de inclemencias del [place], por favor llame a Lacy Duverney principal al [phone] para una actualizacin sobre el [place] de cualquier retraso o cierre.  Consejos para la medicacin en dermatologa: Por favor, guarde las cajas en las que vienen los medicamentos de uso tpico para ayudarle a seguir las instrucciones sobre dnde y cmo usarlos. Las farmacias generalmente imprimen las instrucciones del medicamento slo en las cajas y no directamente en los tubos del [place].   Si su medicamento es muy caro, por favor, pngase en contacto con Rolm Gala llamando al [phone] y presione la opcin 4 o envenos un mensaje a travs de Clinical cytogeneticist.   No podemos decirle cul ser su copago por los medicamentos por adelantado ya que esto es diferente dependiendo de la cobertura de su seguro. Sin embargo, es posible que podamos encontrar un medicamento sustituto a Audiological scientist un formulario para que el seguro cubra el medicamento que se considera necesario.   Si se requiere una autorizacin previa para que su compaa de seguros [place] su medicamento, por favor permtanos de 1 a 2 das hbiles para completar [address]  Los  precios de los medicamentos varan con frecuencia dependiendo del Environmental consultant de dnde se surte la receta y alguna farmacias pueden ofrecer precios ms baratos.  El sitio web www.goodrx.com tiene cupones para medicamentos de Health and safety inspector. Los precios aqu no tienen en cuenta lo que podra costar con la ayuda del seguro (puede ser ms barato con su seguro), pero el sitio web puede darle el precio si no utiliz Tourist information centre manager.  - Puede imprimir el cupn correspondiente y llevarlo con su receta a la farmacia.  - Tambin puede pasar por nuestra oficina durante el horario de atencin regular y Education officer, museum una tarjeta de cupones de GoodRx.  - Si necesita que su receta se enve electrnicamente a una farmacia diferente, informe  a nuestra oficina a travs de MyChart de [place] o por telfono llamando al [phone] y presione la opcin 4.

## 2022-10-04 NOTE — Progress Notes (Signed)
Follow-Up Visit   Subjective  Adam Sherman is a 77 y.o. male who presents for the following: Actinic keratosis 6 months f/u, treated scalp and forehead with 5FU/Calcipotriene cream ~ 6 months ago with a fair response, The patient has spots, moles and lesions to be evaluated, some may be new or changing and the patient has concerns that these could be cancer.   Wife with patient    The following portions of the chart were reviewed this encounter and updated as appropriate: medications, allergies, medical history  Review of Systems:  No other skin or systemic complaints except as noted in HPI or Assessment and Plan.  Objective  Well appearing patient in no apparent distress; mood and affect are within normal limits.  A focused examination was performed of the following areas:face,scalp,arms    Relevant exam findings are noted in the Assessment and Plan.  face,scalp,ears x 12, arns, hand x 6 (18) Erythematous thin papules/macules with gritty scale.     Assessment & Plan   AK (actinic keratosis) (18) face,scalp,ears x 12, arns, hand x 6  Actinic keratoses are precancerous spots that appear secondary to cumulative UV radiation exposure/sun exposure over time. They are chronic with expected duration over 1 year. A portion of actinic keratoses will progress to squamous cell carcinoma of the skin. It is not possible to reliably predict which spots will progress to skin cancer and so treatment is recommended to prevent development of skin cancer.  Recommend daily broad spectrum sunscreen SPF 30+ to sun-exposed areas, reapply every 2 hours as needed.  Recommend staying in the shade or wearing long sleeves, sun glasses (UVA+UVB protection) and wide brim hats (4-inch brim around the entire circumference of the hat). Call for new or changing lesions.   Destruction of lesion - face,scalp,ears x 12, arns, hand x 6 Complexity: simple   Destruction method: cryotherapy   Informed  consent: discussed and consent obtained   Timeout:  patient name, date of birth, surgical site, and procedure verified Lesion destroyed using liquid nitrogen: Yes   Region frozen until ice ball extended beyond lesion: Yes   Outcome: patient tolerated procedure well with no complications   Post-procedure details: wound care instructions given    ACTINIC DAMAGE WITH PRECANCEROUS ACTINIC KERATOSES Counseling for Topical Chemotherapy Management: Patient exhibits: - Severe, confluent actinic changes with pre-cancerous actinic keratoses that is secondary to cumulative UV radiation exposure over time - Condition that is severe; chronic, not at goal. - diffuse scaly erythematous macules and papules with underlying dyspigmentation - Discussed Prescription "Field Treatment" topical Chemotherapy for Severe, Chronic Confluent Actinic Changes with Pre-Cancerous Actinic Keratoses Field treatment involves treatment of an entire area of skin that has confluent Actinic Changes (Sun/ Ultraviolet light damage) and PreCancerous Actinic Keratoses by method of PhotoDynamic Therapy (PDT) and/or prescription Topical Chemotherapy agents such as 5-fluorouracil, 5-fluorouracil/calcipotriene, and/or imiquimod.  The purpose is to decrease the number of clinically evident and subclinical PreCancerous lesions to prevent progression to development of skin cancer by chemically destroying early precancer changes that may or may not be visible.  It has been shown to reduce the risk of developing skin cancer in the treated area. As a result of treatment, redness, scaling, crusting, and open sores may occur during treatment course. One or more than one of these methods may be used and may have to be used several times to control, suppress and eliminate the PreCancerous changes. Discussed treatment course, expected reaction, and possible side effects. - Recommend daily broad  spectrum sunscreen SPF 30+ to sun-exposed areas, reapply every 2  hours as needed.  - Staying in the shade or wearing long sleeves, sun glasses (UVA+UVB protection) and wide brim hats (4-inch brim around the entire circumference of the hat) are also recommended. - Call for new or changing lesions.  In 1 month begin using on the scalp and cheeks twice a day for 7 days   SEBORRHEIC KERATOSIS - Stuck-on, waxy, tan-brown papules and/or plaques  - Benign-appearing - Discussed benign etiology and prognosis. - Observe - Call for any changes   Return in about 6 months (around 04/05/2023) for Aks.  IAngelique Holm, CMA, am acting as scribe for Armida Sans, MD .   Documentation: I have reviewed the above documentation for accuracy and completeness, and I agree with the above.  Armida Sans, MD

## 2022-10-10 ENCOUNTER — Encounter: Payer: Self-pay | Admitting: Dermatology

## 2022-10-16 DIAGNOSIS — E789 Disorder of lipoprotein metabolism, unspecified: Secondary | ICD-10-CM | POA: Diagnosis not present

## 2022-11-27 DIAGNOSIS — H5203 Hypermetropia, bilateral: Secondary | ICD-10-CM | POA: Diagnosis not present

## 2022-11-27 DIAGNOSIS — H524 Presbyopia: Secondary | ICD-10-CM | POA: Diagnosis not present

## 2022-11-27 DIAGNOSIS — H25813 Combined forms of age-related cataract, bilateral: Secondary | ICD-10-CM | POA: Diagnosis not present

## 2022-12-19 ENCOUNTER — Other Ambulatory Visit: Payer: Self-pay | Admitting: Surgery

## 2022-12-19 DIAGNOSIS — I7121 Aneurysm of the ascending aorta, without rupture: Secondary | ICD-10-CM

## 2023-01-22 ENCOUNTER — Other Ambulatory Visit (HOSPITAL_COMMUNITY): Payer: Medicare PPO

## 2023-02-06 NOTE — Progress Notes (Unsigned)
[address]       [place] [address]             [phone]        Adam Sherman [id number] 1946/04/21  History of Present Illness: Adam Sherman is a 77 year old male with a past medical history of HTN, GERD, and asthma. He was incidentally found to have a 4.4cm fusiform ascending aortic aneurysm on CT on 11/26/2020 when scanned for possible interstitial lung disease. His aneurysm has remained stable in size since we began seeing him in 2022. He has a possible family history of aortic dissection in his father, he denies connective tissue disorders. In the past he has had no murmur on exam and it was felt an echocardiogram was not necessary at this time.   Today he denies chest tightness, chest pain, shortness of breath, dizziness, and LOC. He reports he remains active with golf, mowing lawns and helping out with the church.   Current Outpatient Medications on File Prior to Visit  Medication Sig Dispense Refill   amLODipine (NORVASC) 5 MG tablet Take 5 mg by mouth daily.     aspirin EC 81 MG tablet Take 1 tablet (81 mg total) by mouth daily. Swallow whole. 150 tablet 2   famotidine (PEPCID) 20 MG tablet TAKE ONE TABLET BY MOUTH AT BEDTIME AS NEEDED. 90 tablet 3   GLUCOSAMINE HCL-MSM PO Take 1 tablet by mouth daily.     Homeopathic Products (ARNICARE ARTHRITIS) TBDP Take by mouth.     ibuprofen (ADVIL) 400 MG tablet Take 400 mg by mouth every 6 (six) hours as needed.     losartan-hydrochlorothiazide (HYZAAR) 100-25 MG per tablet Take 1 tablet by mouth daily.     omeprazole (PRILOSEC) 40 MG capsule Take 30-60 min before first meal of the day 30 capsule 2   rosuvastatin (CRESTOR) 20 MG tablet Take 1 tablet (20 mg total) by mouth daily. 30 tablet 11   No current facility-administered medications on file prior to visit.   Vitals: Today's Vitals   02/08/23 1320  BP: 126/76  Pulse: 70  Resp: 20  SpO2: 96%  Weight: 204 lb (92.5 kg)  Height: 5\' 8"  (1.727 m)    Body mass index is 31.02 kg/m.   Physical Exam General: No acute distress Neuro: grossly intact CV: Regular rate and rhythm, no murmur Pulm: Clear to auscultation bilaterally Extremities: No edema, 2+ radial pulses bilaterally  CTA Results: CLINICAL DATA:  Follow-up aneurysm of the ascending aorta. No previous relevant surgery.   EXAM: CT ANGIOGRAPHY CHEST WITH CONTRAST   TECHNIQUE: Multidetector CT imaging of the chest was performed using the standard protocol during bolus administration of intravenous contrast. Multiplanar CT image reconstructions and MIPs were obtained to evaluate the vascular anatomy.   RADIATION DOSE REDUCTION: This exam was performed according to the departmental dose-optimization program which includes automated exposure control, adjustment of the mA and/or kV according to patient size and/or use of iterative reconstruction technique.   CONTRAST:  75mL ISOVUE-370 IOPAMIDOL (ISOVUE-370) INJECTION 76%   COMPARISON:  Chest CTA 02/06/2022.  Chest CT 11/26/2020.   FINDINGS: Cardiovascular: No acute vascular findings are identified. Coronary artery atherosclerosis noted. There is stable borderline dilatation of the tubular ascending aorta which has a maximal diameter of 4.0 cm. At the level of the sinuses of Valsalva, the diameter is 4.2 cm, and at the level of the aortic valve 2.4 cm. The aortic arch and descending aorta are  normal in caliber. No evidence of aortic dissection. The heart size is normal. There is no pericardial effusion.   Mediastinum/Nodes: There are no enlarged mediastinal, hilar or axillary lymph nodes.Stable appearance of the thyroid gland, without suspicious finding. The esophagus and trachea appear unremarkable.   Lungs/Pleura: No pleural effusion or pneumothorax. Stable bandlike scarring at both lung bases, greater on the left. Stable 5 x 3 mm subpleural nodule in the lingula on image 66/6, likely an intrapulmonary lymph  node. No focal airspace disease or suspicious pulmonary nodule.   Upper abdomen: The visualized upper abdomen appears stable, without significant findings.   Musculoskeletal/Chest wall: There is no chest wall mass or suspicious osseous finding. Stable mild thoracic spondylosis.   Unless specific follow-up recommendations are mentioned in the findings or impression sections, no imaging follow-up of any mentioned incidental findings is recommended.   Review of the MIP images confirms the above findings.   IMPRESSION: 1. Stable borderline dilatation of the tubular ascending aorta, maximal diameter 4.0 cm. 2. No acute chest findings. 3. Stable scarring at both lung bases, greater on the left. No suspicious pulmonary nodules. 4. Coronary artery atherosclerosis.     Electronically Signed   By: Carey Bullocks M.D.   On: 02/08/2023 14:29   A/P: Thoracic aortic aneurysm: Mr. Mulry presents to the clinic with stable borderline dilatation of the ascending aorta measuring 4.0cm and measuring 4.2cm at the sinuses of valsalva. Dr. Laneta Simmers felt due to the size of his aneurysm and no murmur on exam an echocardiogram was not necessary at this time. We discussed the natural history and and risk factors for growth of ascending aortic aneurysms.  We covered the importance of smoking cessation, tight blood pressure control, refraining from lifting heavy objects, and avoiding fluoroquinolones. The patient is aware of signs and symptoms of aortic dissection and when to present to the emergency department.  His aneurysm currently does not meet surgical criteria of 5.5cm. We will continue surveillance with a CTA in 1 year.  Risk Modification:  Statin:  Rosuvastatin   Smoking cessation instruction/counseling given:  nonsmoker  Patient was counseled on importance of Blood Pressure Control.  Despite Medical intervention if the patient notices persistently elevated blood pressure readings.  They are  instructed to contact their Primary Care Physician  Please avoid use of Fluoroquinolones as this can potentially increase your risk of Aortic Rupture and/or Dissection  Patient educated on signs and symptoms of Aortic Dissection, handout also provided in AVS  Jenny Reichmann, PA-C 02/06/23

## 2023-02-08 ENCOUNTER — Encounter: Payer: Self-pay | Admitting: Physician Assistant

## 2023-02-08 ENCOUNTER — Ambulatory Visit: Payer: Medicare PPO | Admitting: Physician Assistant

## 2023-02-08 ENCOUNTER — Ambulatory Visit
Admission: RE | Admit: 2023-02-08 | Discharge: 2023-02-08 | Disposition: A | Discharge status: Home / Self Care | Payer: Medicare PPO | Source: Ambulatory Visit | Attending: Surgery | Admitting: Surgery

## 2023-02-08 VITALS — BP 126/76 | HR 70 | Resp 20 | Ht 68.0 in | Wt 204.0 lb

## 2023-02-08 DIAGNOSIS — I7121 Aneurysm of the ascending aorta, without rupture: Secondary | ICD-10-CM | POA: Diagnosis not present

## 2023-02-08 MED ORDER — IOPAMIDOL (ISOVUE-370) INJECTION 76%: 75.0000 mL | Freq: Once | INTRAVENOUS | Status: DC | PRN

## 2023-02-08 MED ORDER — IOPAMIDOL (ISOVUE-370) INJECTION 76%
200.0000 mL | Freq: Once | INTRAVENOUS | Status: AC | PRN
  Administered 2023-02-08: 75 mL via INTRAVENOUS

## 2023-02-08 NOTE — Patient Instructions (Signed)

## 2023-04-09 ENCOUNTER — Other Ambulatory Visit: Payer: Self-pay

## 2023-04-09 NOTE — Telephone Encounter (Signed)
Denied refill famotidine.  Patient needs ov.

## 2023-04-10 ENCOUNTER — Other Ambulatory Visit: Payer: Self-pay | Admitting: Neurology

## 2023-04-10 ENCOUNTER — Encounter: Payer: Self-pay | Admitting: Internal Medicine

## 2023-04-10 DIAGNOSIS — R4182 Altered mental status, unspecified: Secondary | ICD-10-CM

## 2023-04-12 ENCOUNTER — Encounter: Payer: Self-pay | Admitting: Dermatology

## 2023-04-12 ENCOUNTER — Ambulatory Visit: Payer: Medicare PPO | Admitting: Dermatology

## 2023-04-12 DIAGNOSIS — Z7189 Other specified counseling: Secondary | ICD-10-CM

## 2023-04-12 DIAGNOSIS — L570 Actinic keratosis: Secondary | ICD-10-CM | POA: Diagnosis not present

## 2023-04-12 DIAGNOSIS — L820 Inflamed seborrheic keratosis: Secondary | ICD-10-CM | POA: Diagnosis not present

## 2023-04-12 DIAGNOSIS — Z5111 Encounter for antineoplastic chemotherapy: Secondary | ICD-10-CM | POA: Diagnosis not present

## 2023-04-12 DIAGNOSIS — L578 Other skin changes due to chronic exposure to nonionizing radiation: Secondary | ICD-10-CM

## 2023-04-12 DIAGNOSIS — W908XXA Exposure to other nonionizing radiation, initial encounter: Secondary | ICD-10-CM | POA: Diagnosis not present

## 2023-04-12 DIAGNOSIS — Z79899 Other long term (current) drug therapy: Secondary | ICD-10-CM

## 2023-04-12 DIAGNOSIS — D692 Other nonthrombocytopenic purpura: Secondary | ICD-10-CM

## 2023-04-12 NOTE — Progress Notes (Signed)
Follow-Up Visit   Subjective  Adam Sherman is a 77 y.o. male who presents for the following: Actinic keratosis. 6 month follow up. Tx with LN2 10/04/2022; face, scalp, ears, arms, hand.  Used 5FU/Calcipotriene on scalp and cheeks twice a day for 7 days since last visit.    The patient has spots, moles and lesions to be evaluated, some may be new or changing and the patient may have concern these could be cancer.  This patient is accompanied in the office by his spouse.    The following portions of the chart were reviewed this encounter and updated as appropriate: medications, allergies, medical history  Review of Systems:  No other skin or systemic complaints except as noted in HPI or Assessment and Plan.  Objective  Well appearing patient in no apparent distress; mood and affect are within normal limits.  A focused examination was performed of the following areas: Scalp, face, ears, arms, hands  Relevant exam findings are noted in the Assessment and Plan.  Scalp x1, left temple x1 (2) Erythematous thin papules/macules with gritty scale.   arms and hands x4, R calf x1, left calf x2 (7) Erythematous keratotic or waxy stuck-on papule or plaque.    Assessment & Plan   AK (actinic keratosis) (2) Scalp x1, left temple x1  Actinic keratoses are precancerous spots that appear secondary to cumulative UV radiation exposure/sun exposure over time. They are chronic with expected duration over 1 year. A portion of actinic keratoses will progress to squamous cell carcinoma of the skin. It is not possible to reliably predict which spots will progress to skin cancer and so treatment is recommended to prevent development of skin cancer.  Recommend daily broad spectrum sunscreen SPF 30+ to sun-exposed areas, reapply every 2 hours as needed.  Recommend staying in the shade or wearing long sleeves, sun glasses (UVA+UVB protection) and wide brim hats (4-inch brim around the entire  circumference of the hat). Call for new or changing lesions.  Destruction of lesion - Scalp x1, left temple x1 (2) Complexity: simple   Destruction method: cryotherapy   Informed consent: discussed and consent obtained   Timeout:  patient name, date of birth, surgical site, and procedure verified Lesion destroyed using liquid nitrogen: Yes   Region frozen until ice ball extended beyond lesion: Yes   Outcome: patient tolerated procedure well with no complications   Post-procedure details: wound care instructions given   Additional details:  Prior to procedure, discussed risks of blister formation, small wound, skin dyspigmentation, or rare scar following cryotherapy. Recommend Vaseline ointment to treated areas while healing.   Inflamed seborrheic keratosis (7) arms and hands x4, R calf x1, left calf x2  Symptomatic, irritating, patient would like treated.  Destruction of lesion - arms and hands x4, R calf x1, left calf x2 (7) Complexity: simple   Destruction method: cryotherapy   Informed consent: discussed and consent obtained   Timeout:  patient name, date of birth, surgical site, and procedure verified Lesion destroyed using liquid nitrogen: Yes   Region frozen until ice ball extended beyond lesion: Yes   Outcome: patient tolerated procedure well with no complications   Post-procedure details: wound care instructions given   Additional details:  Prior to procedure, discussed risks of blister formation, small wound, skin dyspigmentation, or rare scar following cryotherapy. Recommend Vaseline ointment to treated areas while healing.      ACTINIC DAMAGE WITH PRECANCEROUS ACTINIC KERATOSES Counseling for Topical Chemotherapy Management: Patient exhibits: - Severe,  confluent actinic changes with pre-cancerous actinic keratoses that is secondary to cumulative UV radiation exposure over time - Condition that is severe; chronic, not at goal. - diffuse scaly erythematous macules and  papules with underlying dyspigmentation - Discussed Prescription "Field Treatment" topical Chemotherapy for Severe, Chronic Confluent Actinic Changes with Pre-Cancerous Actinic Keratoses Field treatment involves treatment of an entire area of skin that has confluent Actinic Changes (Sun/ Ultraviolet light damage) and PreCancerous Actinic Keratoses by method of PhotoDynamic Therapy (PDT) and/or prescription Topical Chemotherapy agents such as 5-fluorouracil, 5-fluorouracil/calcipotriene, and/or imiquimod.  The purpose is to decrease the number of clinically evident and subclinical PreCancerous lesions to prevent progression to development of skin cancer by chemically destroying early precancer changes that may or may not be visible.  It has been shown to reduce the risk of developing skin cancer in the treated area. As a result of treatment, redness, scaling, crusting, and open sores may occur during treatment course. One or more than one of these methods may be used and may have to be used several times to control, suppress and eliminate the PreCancerous changes. Discussed treatment course, expected reaction, and possible side effects. - Recommend daily broad spectrum sunscreen SPF 30+ to sun-exposed areas, reapply every 2 hours as needed.  - Staying in the shade or wearing long sleeves, sun glasses (UVA+UVB protection) and wide brim hats (4-inch brim around the entire circumference of the hat) are also recommended. - Call for new or changing lesions.  Repeat Fluorouracil - Calcipotriene treatment to scalp twice daily for 7 days  Reviewed course of treatment and expected reaction.  Patient advised to expect inflammation and crusting and advised that erosions are possible.  Patient advised to be diligent with sun protection during and after treatment. Handout with details of how to apply medication and what to expect provided. Counseled to keep medication out of reach of children and pets.   Purpura -  Chronic; persistent and recurrent.  Treatable, but not curable. - Violaceous macules and patches at arms - Benign - Related to trauma, age, sun damage and/or use of blood thinners, chronic use of topical and/or oral steroids - Observe - Can use OTC arnica containing moisturizer such as Dermend Bruise Formula if desired - Call for worsening or other concerns   Return in about 8 months (around 12/10/2023) for AK Follow Up.  I, Lawson Radar, CMA, am acting as scribe for Armida Sans, MD.   Documentation: I have reviewed the above documentation for accuracy and completeness, and I agree with the above.  Armida Sans, MD

## 2023-04-12 NOTE — Patient Instructions (Addendum)
Repeat Fluorouracil - Calcipotriene treatment to scalp twice daily for 7 days  Reviewed course of treatment and expected reaction.  Patient advised to expect inflammation and crusting and advised that erosions are possible.  Patient advised to be diligent with sun protection during and after treatment. Handout with details of how to apply medication and what to expect provided. Counseled to keep medication out of reach of children and pets.     Cryotherapy Aftercare  Wash gently with soap and water everyday.   Apply Vaseline Jelly daily until healed.     Recommend daily broad spectrum sunscreen SPF 30+ to sun-exposed areas, reapply every 2 hours as needed. Call for new or changing lesions.  Staying in the shade or wearing long sleeves, sun glasses (UVA+UVB protection) and wide brim hats (4-inch brim around the entire circumference of the hat) are also recommended for sun protection.      Due to recent changes in healthcare laws, you may see results of your pathology and/or laboratory studies on MyChart before the doctors have had a chance to review them. We understand that in some cases there may be results that are confusing or concerning to you. Please understand that not all results are received at the same time and often the doctors may need to interpret multiple results in order to provide you with the best plan of care or course of treatment. Therefore, we ask that you please give [place] 2 business days to thoroughly review all your results before contacting the office for clarification. Should we see a critical lab result, you will be contacted sooner.   If You Need Anything After Your Visit  If you have any questions or concerns for your doctor, please call our main line at [phone] and press option 4 to reach your doctor's medical assistant. If no one answers, please leave a voicemail as directed and we will return your call as soon as possible. Messages left after 4 pm will be  answered the following business day.   You may also send [place] a message via MyChart. We typically respond to MyChart messages within 1-2 business days.  For prescription refills, please ask your pharmacy to contact our office. Our fax number is [phone].  If you have an urgent issue when the clinic is closed that cannot wait until the next business day, you can page your doctor at the number below.    Please note that while we do our best to be available for urgent issues outside of office hours, we are not available 24/7.   If you have an urgent issue and are unable to reach [place], you may choose to seek medical care at your doctor's office, retail clinic, urgent care center, or emergency room.  If you have a medical emergency, please immediately call 911 or go to the emergency department.  Pager Numbers  - Dr. Gwen Pounds: [phone]  - Dr. Roseanne Reno: [phone]  - Dr. Katrinka Blazing: [phone]   In the event of inclement weather, please call our main line at [phone] for an update on the status of any delays or closures.  Dermatology Medication Tips: Please keep the boxes that topical medications come in in order to help keep track of the instructions about where and how to use these. Pharmacies typically print the medication instructions only on the boxes and not directly on the medication tubes.   If your medication is too expensive, please contact our office at [phone] option 4 or send [place] a message through MyChart.  We are unable to tell what your co-pay for medications will be in advance as this is different depending on your insurance coverage. However, we may be able to find a substitute medication at lower cost or fill out paperwork to get insurance to cover a needed medication.   If a prior authorization is required to get your medication covered by your insurance company, please allow [place] 1-2 business days to complete this process.  Drug prices often vary depending on  where the prescription is filled and some pharmacies may offer cheaper prices.  The website www.goodrx.com contains coupons for medications through different pharmacies. The prices here do not account for what the cost may be with help from insurance (it may be cheaper with your insurance), but the website can give you the price if you did not use any insurance.  - You can print the associated coupon and take it with your prescription to the pharmacy.  - You may also stop by our office during regular business hours and pick up a GoodRx coupon card.  - If you need your prescription sent electronically to a different pharmacy, notify our office through [facility] or by phone at [phone] option 4.     Si Usted Necesita Algo Despus de Su Visita  Tambin puede enviarnos un mensaje a travs de Clinical cytogeneticist. Por lo general respondemos a los mensajes de MyChart en el transcurso de 1 a 2 das hbiles.  Para renovar recetas, por favor pida a su farmacia que se ponga en contacto con nuestra oficina. Annie Sable de fax es [place] [phone].  Si tiene un asunto urgente cuando la clnica est cerrada y que no puede esperar hasta el siguiente da hbil, puede llamar/localizar a su doctor(a) al nmero que aparece a continuacin.   Por favor, tenga en cuenta que aunque hacemos todo lo posible para estar disponibles para asuntos urgentes fuera del horario de [place], no estamos disponibles las 24 horas del da, los 7 [address]  [address] de la [place].   Si tiene un problema urgente y no puede comunicarse con nosotros, puede optar por buscar atencin mdica  en el consultorio de su doctor(a), en una clnica privada, en un centro de atencin urgente o en una sala de emergencias.  Si tiene Engineer, drilling, por favor llame inmediatamente al 911 o vaya a la sala de emergencias.  Nmeros de bper  - Dr. Gwen Pounds: [phone]  - Dra. Roseanne Reno: [phone]  - Dr. Katrinka Blazing: [phone]   En caso de inclemencias  del tiempo, por favor llame a Lacy Duverney principal al [phone] para una actualizacin sobre el [place] de cualquier retraso o cierre.  Consejos para la medicacin en dermatologa: Por favor, guarde las cajas en las que vienen los medicamentos de uso tpico para ayudarle a seguir las instrucciones sobre dnde y cmo usarlos. Las farmacias generalmente imprimen las instrucciones del medicamento slo en las cajas y no directamente en los tubos del [place].   Si su medicamento es muy caro, por favor, pngase en contacto con Rolm Gala llamando al [phone] y presione la opcin 4 o envenos un mensaje a travs de Clinical cytogeneticist.   No podemos decirle cul ser su copago por los medicamentos por adelantado ya que esto es diferente dependiendo de la cobertura de su seguro. Sin embargo, es posible que podamos encontrar un medicamento sustituto a Audiological scientist un formulario para que el seguro cubra el medicamento que se considera necesario.   Si se requiere una autorizacin previa para que  su compaa de seguros [place] su medicamento, por favor permtanos de 1 a 2 das hbiles para completar [address]  Los precios de los medicamentos varan con frecuencia dependiendo del Environmental consultant de dnde se surte la receta y alguna farmacias pueden ofrecer precios ms baratos.  El sitio web www.goodrx.com tiene cupones para medicamentos de Health and safety inspector. Los precios aqu no tienen en cuenta lo que podra costar con la ayuda del seguro (puede ser ms barato con su seguro), pero el sitio web puede darle el precio si no utiliz Tourist information centre manager.  - Puede imprimir el cupn correspondiente y llevarlo con su receta a la farmacia.  - Tambin puede pasar por nuestra oficina durante el horario de atencin regular y Education officer, museum una tarjeta de cupones de GoodRx.  - Si necesita que su receta se enve electrnicamente a una farmacia diferente, informe a nuestra oficina a travs de MyChart de [place] o por telfono  llamando al [phone] y presione la opcin 4.

## 2023-04-16 DIAGNOSIS — I10 Essential (primary) hypertension: Secondary | ICD-10-CM | POA: Diagnosis not present

## 2023-04-16 DIAGNOSIS — Z125 Encounter for screening for malignant neoplasm of prostate: Secondary | ICD-10-CM | POA: Diagnosis not present

## 2023-04-16 DIAGNOSIS — R5383 Other fatigue: Secondary | ICD-10-CM | POA: Diagnosis not present

## 2023-04-16 DIAGNOSIS — E789 Disorder of lipoprotein metabolism, unspecified: Secondary | ICD-10-CM | POA: Diagnosis not present

## 2023-04-23 DIAGNOSIS — I10 Essential (primary) hypertension: Secondary | ICD-10-CM | POA: Diagnosis not present

## 2023-04-23 DIAGNOSIS — Z0000 Encounter for general adult medical examination without abnormal findings: Secondary | ICD-10-CM | POA: Diagnosis not present

## 2023-04-23 DIAGNOSIS — E789 Disorder of lipoprotein metabolism, unspecified: Secondary | ICD-10-CM | POA: Diagnosis not present

## 2023-04-23 DIAGNOSIS — I6523 Occlusion and stenosis of bilateral carotid arteries: Secondary | ICD-10-CM | POA: Diagnosis not present

## 2023-05-02 DIAGNOSIS — I6523 Occlusion and stenosis of bilateral carotid arteries: Secondary | ICD-10-CM | POA: Diagnosis not present

## 2023-06-01 ENCOUNTER — Ambulatory Visit (AMBULATORY_SURGERY_CENTER): Payer: Medicare PPO

## 2023-06-01 VITALS — Ht 68.0 in | Wt 202.0 lb

## 2023-06-01 DIAGNOSIS — Z860100 Personal history of colon polyps, unspecified: Secondary | ICD-10-CM

## 2023-06-01 NOTE — Progress Notes (Signed)
Pre visit completed via phone call; Patient verified name, DOB, and address; No egg or soy allergy known to patient;  No issues known to pt with past sedation with any surgeries or procedures; Patient denies ever being told they had issues or difficulty with intubation;  No FH of Malignant Hyperthermia; Pt is not on diet pills; Pt is not on home 02;  Pt is not on blood thinners;  Pt denies issues with constipation  No A fib or A flutter; Have any cardiac testing pending--NO Insurance verified during PV appt--- [employer] Medicare Pt can ambulate without assistance;  Pt denies use of chewing tobacco; Discussed diabetic/weight loss medication holds; Discussed NSAID holds; Checked BMI to be less than 50; Pt instructed to use Singlecare.com or GoodRx for a price reduction on prep;  Patient's chart reviewed by Cathlyn Parsons CNRA prior to previsit and patient appropriate for the [facility]; Pre visit completed and red dot placed by patient's name on their procedure day (on provider's schedule);   Instructions sent to MyChart as well as printed and mailed to the patient per his request;

## 2023-07-06 ENCOUNTER — Encounter: Payer: Self-pay | Admitting: Internal Medicine

## 2023-07-06 ENCOUNTER — Ambulatory Visit (AMBULATORY_SURGERY_CENTER): Payer: Medicare PPO | Admitting: Internal Medicine

## 2023-07-06 VITALS — BP 105/70 | HR 64 | Temp 98.4°F | Resp 13 | Ht 68.0 in | Wt 202.0 lb

## 2023-07-06 DIAGNOSIS — D124 Benign neoplasm of descending colon: Secondary | ICD-10-CM

## 2023-07-06 DIAGNOSIS — Z1211 Encounter for screening for malignant neoplasm of colon: Secondary | ICD-10-CM | POA: Diagnosis not present

## 2023-07-06 DIAGNOSIS — Z860101 Personal history of adenomatous and serrated colon polyps: Secondary | ICD-10-CM | POA: Diagnosis not present

## 2023-07-06 DIAGNOSIS — D123 Benign neoplasm of transverse colon: Secondary | ICD-10-CM | POA: Diagnosis not present

## 2023-07-06 DIAGNOSIS — D125 Benign neoplasm of sigmoid colon: Secondary | ICD-10-CM

## 2023-07-06 DIAGNOSIS — Z860100 Personal history of colon polyps, unspecified: Secondary | ICD-10-CM

## 2023-07-06 DIAGNOSIS — K635 Polyp of colon: Secondary | ICD-10-CM | POA: Diagnosis not present

## 2023-07-06 DIAGNOSIS — K648 Other hemorrhoids: Secondary | ICD-10-CM

## 2023-07-06 DIAGNOSIS — K5730 Diverticulosis of large intestine without perforation or abscess without bleeding: Secondary | ICD-10-CM | POA: Diagnosis not present

## 2023-07-06 DIAGNOSIS — I10 Essential (primary) hypertension: Secondary | ICD-10-CM | POA: Diagnosis not present

## 2023-07-06 DIAGNOSIS — J45909 Unspecified asthma, uncomplicated: Secondary | ICD-10-CM | POA: Diagnosis not present

## 2023-07-06 MED ORDER — SODIUM CHLORIDE 0.9 % IV SOLN: 500.0000 mL | Freq: Once | INTRAVENOUS | Status: DC

## 2023-07-06 NOTE — Progress Notes (Signed)
Pt's states no medical or surgical changes since previsit or office visit.

## 2023-07-06 NOTE — Progress Notes (Signed)
Report to PACU, RN, vss, BBS= Clear.

## 2023-07-06 NOTE — Patient Instructions (Signed)
Resume previous diet and medications. Awaiting pathology results. Repeat Colonoscopy date to be determined based on pathology results. Handouts provided on Colon polyps, Diverticulosis and Hemorrhoids   YOU HAD AN ENDOSCOPIC PROCEDURE TODAY AT THE [facility]:   Refer to the procedure report that was given to you for any specific questions about what was found during the examination.  If the procedure report does not answer your questions, please call your gastroenterologist to clarify.  If you requested that your care partner not be given the details of your procedure findings, then the procedure report has been included in a sealed envelope for you to review at your convenience later.  YOU SHOULD EXPECT: Some feelings of bloating in the abdomen. Passage of more gas than usual.  Walking can help get rid of the air that was put into your GI tract during the procedure and reduce the bloating. If you had a lower endoscopy (such as a colonoscopy or flexible sigmoidoscopy) you may notice spotting of blood in your stool or on the toilet paper. If you underwent a bowel prep for your procedure, you may not have a normal bowel movement for a few days.  Please Note:  You might notice some irritation and congestion in your nose or some drainage.  This is from the oxygen used during your procedure.  There is no need for concern and it should clear up in a day or so.  SYMPTOMS TO REPORT IMMEDIATELY:  Following lower endoscopy (colonoscopy or flexible sigmoidoscopy):  Excessive amounts of blood in the stool  Significant tenderness or worsening of abdominal pains  Swelling of the abdomen that is new, acute  Fever of 100F or higher  For urgent or emergent issues, a gastroenterologist can be reached at any hour by calling [phone]. Do not use MyChart messaging for urgent concerns.    DIET:  We do recommend a small meal at first, but then you may proceed to your regular diet.  Drink plenty of  fluids but you should avoid alcoholic beverages for 24 hours.  ACTIVITY:  You should plan to take it easy for the rest of today and you should NOT DRIVE or use heavy machinery until tomorrow (because of the sedation medicines used during the test).    FOLLOW UP: Our staff will call the number listed on your records the next business day following your procedure.  We will call around 7:15- 8:00 am to check on you and address any questions or concerns that you may have regarding the information given to you following your procedure. If we do not reach you, we will leave a message.     If any biopsies were taken you will be contacted by phone or by letter within the next 1-3 weeks.  Please call us at [phone] if you have not heard about the biopsies in 3 weeks.    SIGNATURES/CONFIDENTIALITY: You and/or your care partner have signed paperwork which will be entered into your electronic medical record.  These signatures attest to the fact that that the information above on your After Visit Summary has been reviewed and is understood.  Full responsibility of the confidentiality of this discharge information lies with you and/or your care-partner.

## 2023-07-06 NOTE — Op Note (Signed)
[facility] Patient Name: Adam Sherman Procedure Date: 07/06/2023 9:37 AM MRN: [id number] Endoscopist: Beverley Fiedler , MD, [id number] Age: 78 Referring MD:  Date of Birth: 11/19/1945 Gender: Male Account #: [id number] Procedure:                Colonoscopy Indications:              High risk colon cancer surveillance: Personal                            history of multiple adenomas, Last colonoscopy:                            October 2021 (TA x 5), 2018 (TA and SSP x 4) Medicines:                Monitored Anesthesia Care Procedure:                Pre-Anesthesia Assessment:                           - Prior to the procedure, a History and Physical                            was performed, and patient medications and                            allergies were reviewed. The patient's tolerance of                            previous anesthesia was also reviewed. The risks                            and benefits of the procedure and the sedation                            options and risks were discussed with the patient.                            All questions were answered, and informed consent                            was obtained. Prior Anticoagulants: The patient has                            taken no anticoagulant or antiplatelet agents. ASA                            Grade Assessment: II - A patient with mild systemic                            disease. After reviewing the risks and benefits,                            the patient was deemed in satisfactory condition to  undergo the procedure.                           After obtaining informed consent, the colonoscope                            was passed under direct vision. Throughout the                            procedure, the patient's blood pressure, pulse, and                            oxygen saturations were monitored continuously. The                            Olympus Scope  [id number] was introduced through the                            anus and advanced to the cecum, identified by                            appendiceal orifice and ileocecal valve. The                            colonoscopy was performed without difficulty. The                            patient tolerated the procedure well. The quality                            of the bowel preparation was good. The ileocecal                            valve, appendiceal orifice, and rectum were                            photographed. Scope In: 9:44:22 AM Scope Out: 10:01:46 AM Scope Withdrawal Time: 0 hours 14 minutes 8 seconds  Total Procedure Duration: 0 hours 17 minutes 24 seconds  Findings:                 The digital rectal exam was normal.                           A 5 mm polyp was found in the transverse colon. The                            polyp was sessile. The polyp was removed with a                            cold snare. Resection and retrieval were complete.                           Two sessile polyps were found in the descending  colon. The polyps were 4 to 5 mm in size. These                            polyps were removed with a cold snare. Resection                            and retrieval were complete.                           Two sessile polyps were found in the sigmoid colon.                            The polyps were 3 to 4 mm in size. These polyps                            were removed with a cold snare. Resection and                            retrieval were complete.                           Multiple medium-mouthed and small-mouthed                            diverticula were found in the sigmoid colon and                            descending colon.                           Internal hemorrhoids were found during                            retroflexion. The hemorrhoids were small. Complications:            No immediate complications. Estimated  Blood Loss:     Estimated blood loss was minimal. Impression:               - One 5 mm polyp in the transverse colon, removed                            with a cold snare. Resected and retrieved.                           - Two 4 to 5 mm polyps in the descending colon,                            removed with a cold snare. Resected and retrieved.                           - Two 3 to 4 mm polyps in the sigmoid colon,                            removed with a cold snare.  Resected and retrieved.                           - Mild diverticulosis in the sigmoid colon and in                            the descending colon.                           - Small internal hemorrhoids. Recommendation:           - Patient has a contact number available for                            emergencies. The signs and symptoms of potential                            delayed complications were discussed with the                            patient. Return to normal activities tomorrow.                            Written discharge instructions were provided to the                            patient.                           - Resume previous diet.                           - Continue present medications.                           - Await pathology results.                           - Repeat colonoscopy is recommended for                            surveillance. The colonoscopy date will be                            determined after pathology results from today's                            exam become available for review. Beverley Fiedler, MD 07/06/2023 10:05:24 AM This report has been signed electronically.

## 2023-07-06 NOTE — Progress Notes (Signed)
GASTROENTEROLOGY PROCEDURE H&P NOTE   Primary Care Physician: Merri Brunette, MD    Reason for Procedure:  History of numbness and sessile serrated polyps  Plan:    Colonoscopy  Patient is appropriate for endoscopic procedure(s) in the ambulatory ([facility]) setting.  The nature of the procedure, as well as the risks, benefits, and alternatives were carefully and thoroughly reviewed with the patient. Ample time for discussion and questions allowed. The patient understood, was satisfied, and agreed to proceed.     HPI: Adam Sherman is a 78 y.o. male who presents for surveillance colonoscopy.  Medical history as below.  Tolerated the prep.  No recent chest pain or shortness of breath.  No abdominal pain today.  Past Medical History:  Diagnosis Date   Arthritis    thumb   Asthma    Cataract    GERD (gastroesophageal reflux disease)    History of kidney stones    Hypertension    Squamous cell carcinoma of skin 08/05/2018   right dorsum of hand    Past Surgical History:  Procedure Laterality Date   BACK SURGERY  2012   ruptured disc   CARPAL TUNNEL RELEASE Right 2006   CARPAL TUNNEL RELEASE Left 2021   COLONOSCOPY  03/2020   JMP-MAC-prep good-polyps+   LUMBAR LAMINECTOMY/DECOMPRESSION MICRODISCECTOMY Left 01/27/2020   Procedure: Left Lumbar Three-Four  Microdiscectomy;  Surgeon: Maeola Harman, MD;  Location[facility] OR;  Service: Neurosurgery;  Laterality: Left;   POLYPECTOMY  2021    Prior to Admission medications   Medication Sig Start Date End Date Taking? Authorizing Provider  amLODipine (NORVASC) 5 MG tablet Take 5 mg by mouth daily. 05/31/15  Yes [provider]  losartan-hydrochlorothiazide (HYZAAR) 100-25 MG per tablet Take 1 tablet by mouth daily.   Yes [provider]  omeprazole (PRILOSEC) 40 MG capsule Take 30-60 min before first meal of the day 10/25/20  Yes Nyoka Cowden, MD  aspirin EC 81 MG tablet Take 81 mg by mouth every other day.     [provider]  cyclobenzaprine (FLEXERIL) 10 MG tablet Take 10 mg by mouth daily as needed for muscle spasms. 04/15/21   [provider]  GLUCOSAMINE HCL-MSM PO Take 1 tablet by mouth daily.    [provider]  Homeopathic Products (ARNICARE ARTHRITIS) TBDP Take by mouth.    [provider]  ibuprofen (ADVIL) 400 MG tablet Take 400 mg by mouth every 6 (six) hours as needed.    [provider]  rosuvastatin (CRESTOR) 20 MG tablet Take 1 tablet (20 mg total) by mouth daily. Patient taking differently: Take 20 mg by mouth every other day. Margorie John, F 04/14/22 06/01/23  Maurilio Lovely D, DO    Current Outpatient Medications  Medication Sig Dispense Refill   amLODipine (NORVASC) 5 MG tablet Take 5 mg by mouth daily.     losartan-hydrochlorothiazide (HYZAAR) 100-25 MG per tablet Take 1 tablet by mouth daily.     omeprazole (PRILOSEC) 40 MG capsule Take 30-60 min before first meal of the day 30 capsule 2   aspirin EC 81 MG tablet Take 81 mg by mouth every other day.     cyclobenzaprine (FLEXERIL) 10 MG tablet Take 10 mg by mouth daily as needed for muscle spasms.     GLUCOSAMINE HCL-MSM PO Take 1 tablet by mouth daily.     Homeopathic Products (ARNICARE ARTHRITIS) TBDP Take by mouth.     ibuprofen (ADVIL) 400 MG tablet Take 400 mg  by mouth every 6 (six) hours as needed.     rosuvastatin (CRESTOR) 20 MG tablet Take 1 tablet (20 mg total) by mouth daily. (Patient taking differently: Take 20 mg by mouth every other day. M, W, F) 30 tablet 11   Current Facility-Administered Medications  Medication Dose Route Frequency Provider Last Rate Last Admin   0.9 %  sodium chloride infusion  500 mL Intravenous Once Keghan Mcfarren, Carie Caddy, MD        Allergies as of 07/06/2023   (No Known Allergies)    Family History  Problem Relation Age of Onset   Colon cancer Neg Hx    Stomach cancer Neg Hx    Colon polyps Neg Hx    Esophageal cancer Neg Hx    Rectal cancer Neg Hx      Social History   Socioeconomic History   Marital status: Married    Spouse name: Not on file   Number of children: Not on file   Years of education: Not on file   Highest education level: Not on file  Occupational History   Not on file  Tobacco Use   Smoking status: Former    Current packs/day: 0.00    Average packs/day: 0.1 packs/day for 3.0 years (0.3 ttl pk-yrs)    Types: Cigarettes    Start date: 06/13/1967    Quit date: 06/12/1970    Years since quitting: 53.1   Smokeless tobacco: Never  Vaping Use   Vaping status: Never Used  Substance and Sexual Activity   Alcohol use: No    Alcohol/week: 0.0 standard drinks of alcohol   Drug use: No   Sexual activity: Not on file  Other Topics Concern   Not on file  Social History Narrative   Not on file   Social Drivers of Health   Financial Resource Strain: Not on file  Food Insecurity: No Food Insecurity (04/14/2022)   Hunger Vital Sign    Worried About Running Out of Food in the Last Year: Never true    Ran Out of Food in the Last Year: Never true  Transportation Needs: No Transportation Needs (04/14/2022)   PRAPARE - Administrator, Civil Service (Medical): No    Lack of Transportation (Non-Medical): No  Physical Activity: Not on file  Stress: Not on file  Social Connections: Not on file  Intimate Partner Violence: Not At Risk (04/14/2022)   Humiliation, Afraid, Rape, and Kick questionnaire    Fear of Current or Ex-Partner: No    Emotionally Abused: No    Physically Abused: No    Sexually Abused: No    Physical Exam: Vital signs in last 24 hours: @BP  106/60   Pulse 75   Temp 98.4 F (36.9 C)   Ht 5\' 8"  (1.727 m)   Wt 202 lb (91.6 kg)   SpO2 95%   BMI 30.71 kg/m  GEN: NAD EYE: Sclerae anicteric ENT: MMM CV: Non-tachycardic Pulm: CTA b/l GI: Soft, NT/ND NEURO:  Alert & Oriented x 3   Erick Blinks, MD [facility]  07/06/2023 9:36 AM

## 2023-07-09 ENCOUNTER — Telehealth: Payer: Self-pay

## 2023-07-09 NOTE — Telephone Encounter (Signed)
  Follow up Call-     07/06/2023    8:42 AM  Call back number  Post procedure Call Back phone  # [phone]  Permission to leave phone message Yes     Patient questions:  Do you have a fever, pain , or abdominal swelling? No. Pain Score  0 *  Have you tolerated food without any problems? Yes.    Have you been able to return to your normal activities? Yes.    Do you have any questions about your discharge instructions: Diet   No. Medications  No. Follow up visit  No.  Do you have questions or concerns about your Care? No.  Actions: * If pain score is 4 or above: No action needed, pain <4.

## 2023-07-10 ENCOUNTER — Encounter: Payer: Self-pay | Admitting: Internal Medicine

## 2023-07-10 LAB — SURGICAL PATHOLOGY

## 2023-11-28 ENCOUNTER — Ambulatory Visit: Payer: Medicare PPO | Admitting: Dermatology

## 2023-12-03 DIAGNOSIS — H2513 Age-related nuclear cataract, bilateral: Secondary | ICD-10-CM | POA: Diagnosis not present

## 2023-12-03 DIAGNOSIS — H524 Presbyopia: Secondary | ICD-10-CM | POA: Diagnosis not present

## 2023-12-11 ENCOUNTER — Ambulatory Visit: Admitting: Dermatology

## 2023-12-24 ENCOUNTER — Other Ambulatory Visit: Payer: Self-pay | Admitting: Surgery

## 2023-12-24 DIAGNOSIS — I7121 Aneurysm of the ascending aorta, without rupture: Secondary | ICD-10-CM

## 2023-12-27 ENCOUNTER — Ambulatory Visit (HOSPITAL_COMMUNITY)

## 2024-01-09 ENCOUNTER — Ambulatory Visit: Admitting: Dermatology

## 2024-01-09 DIAGNOSIS — Z79899 Other long term (current) drug therapy: Secondary | ICD-10-CM

## 2024-01-09 DIAGNOSIS — L578 Other skin changes due to chronic exposure to nonionizing radiation: Secondary | ICD-10-CM | POA: Diagnosis not present

## 2024-01-09 DIAGNOSIS — C44619 Basal cell carcinoma of skin of left upper limb, including shoulder: Secondary | ICD-10-CM

## 2024-01-09 DIAGNOSIS — L821 Other seborrheic keratosis: Secondary | ICD-10-CM | POA: Diagnosis not present

## 2024-01-09 DIAGNOSIS — D692 Other nonthrombocytopenic purpura: Secondary | ICD-10-CM | POA: Diagnosis not present

## 2024-01-09 DIAGNOSIS — Z7189 Other specified counseling: Secondary | ICD-10-CM

## 2024-01-09 DIAGNOSIS — L820 Inflamed seborrheic keratosis: Secondary | ICD-10-CM | POA: Diagnosis not present

## 2024-01-09 DIAGNOSIS — L729 Follicular cyst of the skin and subcutaneous tissue, unspecified: Secondary | ICD-10-CM

## 2024-01-09 DIAGNOSIS — W908XXA Exposure to other nonionizing radiation, initial encounter: Secondary | ICD-10-CM | POA: Diagnosis not present

## 2024-01-09 DIAGNOSIS — Z5111 Encounter for antineoplastic chemotherapy: Secondary | ICD-10-CM

## 2024-01-09 DIAGNOSIS — C4491 Basal cell carcinoma of skin, unspecified: Secondary | ICD-10-CM

## 2024-01-09 DIAGNOSIS — L720 Epidermal cyst: Secondary | ICD-10-CM | POA: Diagnosis not present

## 2024-01-09 DIAGNOSIS — D485 Neoplasm of uncertain behavior of skin: Secondary | ICD-10-CM

## 2024-01-09 DIAGNOSIS — L570 Actinic keratosis: Secondary | ICD-10-CM

## 2024-01-09 HISTORY — DX: Basal cell carcinoma of skin, unspecified: C44.91

## 2024-01-09 MED ORDER — FLUOROURACIL 5 % EX CREA: TOPICAL_CREAM | CUTANEOUS | 2 refills | Status: AC

## 2024-01-09 NOTE — Progress Notes (Signed)
 Follow-Up Visit   Subjective  Adam Sherman is a 78 y.o. male who presents for the following: AK follow up, recheck sun exposed areas today.  The following portions of the chart were reviewed this encounter and updated as appropriate: medications, allergies, medical history  Review of Systems:  No other skin or systemic complaints except as noted in HPI or Assessment and Plan.  Objective  Well appearing patient in no apparent distress; mood and affect are within normal limits.   A focused examination was performed of the following areas: the face, scalp, arms, and hands   Relevant exam findings are noted in the Assessment and Plan.  face/ears/scalp/hands x 23 (23) Erythematous thin papules/macules with gritty scale.  L cheek and scalp x 2 (2) Erythematous keratotic or waxy stuck-on papule or plaque. L middle lat tricep 1.0 cm red papule.   Assessment & Plan   SEBORRHEIC KERATOSIS - Stuck-on, waxy, tan-brown papules and/or plaques  - Benign-appearing - Discussed benign etiology and prognosis. - Observe - Call for any changes  AK (ACTINIC KERATOSIS) (23) face/ears/scalp/hands x 23 (23) Actinic keratoses are precancerous spots that appear secondary to cumulative UV radiation exposure/sun exposure over time. They are chronic with expected duration over 1 year. A portion of actinic keratoses will progress to squamous cell carcinoma of the skin. It is not possible to reliably predict which spots will progress to skin cancer and so treatment is recommended to prevent development of skin cancer.  Recommend daily broad spectrum sunscreen SPF 30+ to sun-exposed areas, reapply every 2 hours as needed.  Recommend staying in the shade or wearing long sleeves, sun glasses (UVA+UVB protection) and wide brim hats (4-inch brim around the entire circumference of the hat). Call for new or changing lesions.  ACTINIC DAMAGE WITH PRECANCEROUS ACTINIC KERATOSES Counseling for Topical  Chemotherapy Management: Patient exhibits: - Severe, confluent actinic changes with pre-cancerous actinic keratoses that is secondary to cumulative UV radiation exposure over time - Condition that is severe; chronic, not at goal. - diffuse scaly erythematous macules and papules with underlying dyspigmentation - Discussed Prescription Field Treatment topical Chemotherapy for Severe, Chronic Confluent Actinic Changes with Pre-Cancerous Actinic Keratoses Field treatment involves treatment of an entire area of skin that has confluent Actinic Changes (Sun/ Ultraviolet light damage) and PreCancerous Actinic Keratoses by method of PhotoDynamic Therapy (PDT) and/or prescription Topical Chemotherapy agents such as 5-fluorouracil , 5-fluorouracil /calcipotriene, and/or imiquimod.  The purpose is to decrease the number of clinically evident and subclinical PreCancerous lesions to prevent progression to development of skin cancer by chemically destroying early precancer changes that may or may not be visible.  It has been shown to reduce the risk of developing skin cancer in the treated area. As a result of treatment, redness, scaling, crusting, and open sores may occur during treatment course. One or more than one of these methods may be used and may have to be used several times to control, suppress and eliminate the PreCancerous changes. Discussed treatment course, expected reaction, and possible side effects. - Recommend daily broad spectrum sunscreen SPF 30+ to sun-exposed areas, reapply every 2 hours as needed.  - Staying in the shade or wearing long sleeves, sun glasses (UVA+UVB protection) and wide brim hats (4-inch brim around the entire circumference of the hat) are also recommended. - Call for new or changing lesions. - Starting Sept 15th apply 5FU/Calcipotriene mix BID x 10 days to the scalp and hands. Destruction of lesion - face/ears/scalp/hands x 23 (23) Complexity: simple  Destruction method:  cryotherapy   Informed consent: discussed and consent obtained   Timeout:  patient name, date of birth, surgical site, and procedure verified Lesion destroyed using liquid nitrogen: Yes   Region frozen until ice ball extended beyond lesion: Yes   Outcome: patient tolerated procedure well with no complications   Post-procedure details: wound care instructions given    INFLAMED SEBORRHEIC KERATOSIS (2) L cheek and scalp x 2 (2) Symptomatic, irritating, patient would like treated. Destruction of lesion - L cheek and scalp x 2 (2) Complexity: simple   Destruction method: cryotherapy   Informed consent: discussed and consent obtained   Timeout:  patient name, date of birth, surgical site, and procedure verified Lesion destroyed using liquid nitrogen: Yes   Region frozen until ice ball extended beyond lesion: Yes   Outcome: patient tolerated procedure well with no complications   Post-procedure details: wound care instructions given    NEOPLASM OF UNCERTAIN BEHAVIOR OF SKIN L middle lat tricep Epidermal / dermal shaving  Lesion diameter (cm):  1 Informed consent: discussed and consent obtained   Timeout: patient name, date of birth, surgical site, and procedure verified   Procedure prep:  Patient was prepped and draped in usual sterile fashion Prep type:  Isopropyl alcohol Anesthesia: the lesion was anesthetized in a standard fashion   Anesthetic:  1% lidocaine  w/ epinephrine  1-100,000 buffered w/ 8.4% NaHCO3 Instrument used: flexible razor blade   Hemostasis achieved with: pressure, aluminum chloride and electrodesiccation   Outcome: patient tolerated procedure well   Post-procedure details: sterile dressing applied and wound care instructions given   Dressing type: bandage (Mupirocin 2% ointment)    Destruction of lesion Complexity: extensive   Destruction method: electrodesiccation and curettage   Informed consent: discussed and consent obtained   Timeout:  patient name, date  of birth, surgical site, and procedure verified Procedure prep:  Patient was prepped and draped in usual sterile fashion Prep type:  Isopropyl alcohol Anesthesia: the lesion was anesthetized in a standard fashion   Anesthetic:  1% lidocaine  w/ epinephrine  1-100,000 buffered w/ 8.4% NaHCO3 Curettage performed in three different directions: Yes   Electrodesiccation performed over the curetted area: Yes   Lesion length (cm):  1 Lesion width (cm):  1 Margin per side (cm):  0.2 Final wound size (cm):  1.4 Hemostasis achieved with:  pressure, aluminum chloride and electrodesiccation Outcome: patient tolerated procedure well with no complications   Post-procedure details: sterile dressing applied and wound care instructions given   Dressing type: bandage and petrolatum    Specimen 1 - Surgical pathology Differential Diagnosis: D48.5 r/o pyogenic granuloma vs BCC vs SCC ED&C today Check Margins: No CHEMOTHERAPY MANAGEMENT, ENCOUNTER FOR   COUNSELING AND COORDINATION OF CARE   MEDICATION MANAGEMENT   PURPURA (HCC)   CYST OF SKIN   ACTINIC SKIN DAMAGE   SEBORRHEIC KERATOSIS    Purpura - Chronic; persistent and recurrent.  Treatable, but not curable. - Violaceous macules and patches - Benign - Related to trauma, age, sun damage and/or use of blood thinners, chronic use of topical and/or oral steroids - Observe - Can use OTC arnica containing moisturizer such as Dermend Bruise Formula if desired - Call for worsening or other concerns  EPIDERMAL INCLUSION CYST Exam: Subcutaneous nodule at R sup med scapula 1.5 cm  Benign-appearing. Exam most consistent with an epidermal inclusion cyst. Discussed that a cyst is a benign growth that can grow over time and sometimes get irritated or inflamed. Recommend observation if it  is not bothersome. Discussed option of surgical excision to remove it if it is growing, symptomatic, or other changes noted. Please call for new or changing  lesions so they can be evaluated.  Return in about 6 months (around 07/11/2024) for Ak, ISK, and bx follow up.  LILLETTE Rosina Mayans, CMA, am acting as scribe for Alm Rhyme, MD .   Documentation: I have reviewed the above documentation for accuracy and completeness, and I agree with the above.  Alm Rhyme, MD

## 2024-01-09 NOTE — Patient Instructions (Addendum)
 Instructions for [facility] Medications  One or more of your medications was sent to the [facility] mail order compounding pharmacy. You will receive an email from them and can purchase the medicine through that link. It will then be mailed to your home at the address you confirmed. If for any reason you do not receive an email from them, please check your spam folder. If you still do not find the email, please let us  know. [facility] phone number is [phone].   5-Fluorouracil /Calcipotriene Patient Education   Actinic keratoses are the dry, red scaly spots on the skin caused by sun damage. A portion of these spots can turn into skin cancer with time, and treating them can help prevent development of skin cancer.   Treatment of these spots requires removal of the defective skin cells. There are various ways to remove actinic keratoses, including freezing with liquid nitrogen, treatment with creams, or treatment with a blue light procedure in the office.   5-fluorouracil  cream is a topical cream used to treat actinic keratoses. It works by interfering with the growth of abnormal fast-growing skin cells, such as actinic keratoses. These cells peel off and are replaced by healthy ones. THIS CREAM SHOULD BE KEPT OUT OF REACH OF CHILDREN AND PETS AND SHOULD NOT BE USED BY PREGNANT WOMEN.  5-fluorouracil /calcipotriene is a combination of the 5-fluorouracil  cream with a vitamin D analog cream called calcipotriene. The calcipotriene alone does not treat actinic keratoses. However, when it is combined with 5-fluorouracil , it helps the 5-fluorouracil  treat the actinic keratoses much faster so that the same results can be achieved with a much shorter treatment time.  INSTRUCTIONS FOR 5-FLUOROURACIL /CALCIPOTRIENE CREAM:   5-fluorouracil /calcipotriene cream typically only needs to be used for 4-7 days. A thin layer should be applied twice a day to the treatment areas recommended by your  physician.   If your physician prescribed you separate tubes of 5-fluourouracil and calcipotriene, apply a thin layer of 5-fluorouracil  followed by a thin layer of calcipotriene.   Avoid contact with your eyes or nostrils. Avoid applying the cream to your eyelids or lips unless directed to apply there by your physician. Do not use 5-fluorouracil /calcipotriene cream on infected or open wounds.   You will develop redness, irritation and some crusting at areas where you have pre-cancer damage/actinic keratoses. IF YOU DEVELOP PAIN, BLEEDING, OR SIGNIFICANT CRUSTING, STOP THE TREATMENT EARLY - you have already gotten a good response and the actinic keratoses should clear up well.  Wash your hands after applying 5-fluorouracil  5% cream on your skin.   A moisturizer or sunscreen with a minimum SPF 30 should be applied each morning.   Once you have finished the treatment, you can apply a thin layer of Vaseline twice a day to irritated areas to soothe and calm the areas more quickly. If you experience significant discomfort, contact your physician.  For some patients it is necessary to repeat the treatment for best results.  SIDE EFFECTS: When using 5-fluorouracil /calcipotriene cream, you may have mild irritation, such as redness, dryness, swelling, or a mild burning sensation. This usually resolves within 2 weeks. The more actinic keratoses you have, the more redness and inflammation you can expect during treatment. Eye irritation has been reported rarely. If this occurs, please let us  know.   If you have any trouble using this cream, please send us  a MyChart message or call the office. If you have any other questions about this information, please do not hesitate to ask me  before you leave the office or contact me on MyChart or by phone.    Electrodesiccation and Curettage ("Scrape and Burn") Wound Care Instructions  Leave the original bandage on for 24 hours if possible.  If the bandage becomes  soaked or soiled before that time, it is OK to remove it and examine the wound.  A small amount of post-operative bleeding is normal.  If excessive bleeding occurs, remove the bandage, place gauze over the site and apply continuous pressure (no peeking) over the area for 30 minutes. If this does not work, please call our clinic as soon as possible or page your doctor if it is after hours.   Once a day, cleanse the wound with soap and water. It is fine to shower. If a thick crust develops you may use a Q-tip dipped into dilute hydrogen peroxide (mix 1:1 with water) to dissolve it.  Hydrogen peroxide can slow the healing process, so use it only as needed.    After washing, apply petroleum jelly (Vaseline) or an antibiotic ointment if your doctor prescribed one for you, followed by a bandage.    For best healing, the wound should be covered with a layer of ointment at all times. If you are not able to keep the area covered with a bandage to hold the ointment in place, this may mean re-applying the ointment several times a day.  Continue this wound care until the wound has healed and is no longer open. It may take several weeks for the wound to heal and close.  Itching and mild discomfort is normal during the healing process.  If you have any discomfort, you can take Tylenol  (acetaminophen ) or ibuprofen as directed on the bottle. (Please do not take these if you have an allergy to them or cannot take them for another reason).  Some redness, tenderness and white or yellow material in the wound is normal healing.  If the area becomes very sore and red, or develops a thick yellow-green material (pus), it may be infected; please notify us .    Wound healing continues for up to one year following surgery. It is not unusual to experience pain in the scar from time to time during the interval.  If the pain becomes severe or the scar thickens, you should notify the office.    A slight amount of redness in a scar  is expected for the first six months.  After six months, the redness will fade and the scar will soften and fade.  The color difference becomes less noticeable with time.  If there are any problems, return for a post-op surgery check at your earliest convenience.  To improve the appearance of the scar, you can use silicone scar gel, cream, or sheets (such as Mederma or Serica) every night for up to one year. These are available over the counter (without a prescription).  Please call our office at [phone] for any questions or concerns.  Due to recent changes in healthcare laws, you may see results of your pathology and/or laboratory studies on MyChart before the doctors have had a chance to review them. We understand that in some cases there may be results that are confusing or concerning to you. Please understand that not all results are received at the same time and often the doctors may need to interpret multiple results in order to provide you with the best plan of care or course of treatment. Therefore, we ask that you please give us  2 business  days to thoroughly review all your results before contacting the office for clarification. Should we see a critical lab result, you will be contacted sooner.   If You Need Anything After Your Visit  If you have any questions or concerns for your doctor, please call our main line at [phone] and press option 4 to reach your doctor's medical assistant. If no one answers, please leave a voicemail as directed and we will return your call as soon as possible. Messages left after 4 pm will be answered the following business day.   You may also send us  a message via MyChart. We typically respond to MyChart messages within 1-2 business days.  For prescription refills, please ask your pharmacy to contact our office. Our fax number is [phone].  If you have an urgent issue when the clinic is closed that cannot wait until the next business day, you can  page your doctor at the number below.    Please note that while we do our best to be available for urgent issues outside of office hours, we are not available 24/7.   If you have an urgent issue and are unable to reach us , you may choose to seek medical care at your doctor's office, retail clinic, urgent care center, or emergency room.  If you have a medical emergency, please immediately call 911 or go to the emergency department.  Pager Numbers  - Dr. Hester: [phone]  - Dr. Jackquline: [phone]  - Dr. Claudene: [phone]   In the event of inclement weather, please call our main line at [phone] for an update on the status of any delays or closures.  Dermatology Medication Tips: Please keep the boxes that topical medications come in in order to help keep track of the instructions about where and how to use these. Pharmacies typically print the medication instructions only on the boxes and not directly on the medication tubes.   If your medication is too expensive, please contact our office at [phone] option 4 or send us  a message through MyChart.   We are unable to tell what your co-pay for medications will be in advance as this is different depending on your insurance coverage. However, we may be able to find a substitute medication at lower cost or fill out paperwork to get insurance to cover a needed medication.   If a prior authorization is required to get your medication covered by your insurance company, please allow us  1-2 business days to complete this process.  Drug prices often vary depending on where the prescription is filled and some pharmacies may offer cheaper prices.  The website www.goodrx.com contains coupons for medications through different pharmacies. The prices here do not account for what the cost may be with help from insurance (it may be cheaper with your insurance), but the website can give you the price if you did not use any insurance.  - You  can print the associated coupon and take it with your prescription to the pharmacy.  - You may also stop by our office during regular business hours and pick up a GoodRx coupon card.  - If you need your prescription sent electronically to a different pharmacy, notify our office through [facility] or by phone at [phone] option 4.     Si Usted Necesita Algo Despus de Su Visita  Tambin puede enviarnos un mensaje a travs de Clinical cytogeneticist. Por lo general respondemos a los mensajes de MyChart en el transcurso de 1 a 2 [address]  [address]  [place].  Para renovar recetas, por favor pida a su farmacia que se ponga en contacto con nuestra oficina. Randi lakes de fax es [place] [phone].  Si tiene un asunto urgente cuando la clnica est cerrada y que no puede esperar hasta el siguiente da hbil, puede llamar/localizar a su doctor(a) al nmero que aparece a continuacin.   Por favor, tenga en cuenta que aunque hacemos todo lo posible para estar disponibles para asuntos urgentes fuera del horario de [place], no estamos disponibles las 24 horas del da, los 7 [address]  [address] de la [place].   Si tiene un problema urgente y no puede comunicarse con nosotros, puede optar por buscar atencin mdica  en el consultorio de su doctor(a), en una clnica privada, en un centro de atencin urgente o en una sala de emergencias.  Si tiene Engineer, drilling, por favor llame inmediatamente al 911 o vaya a la sala de emergencias.  Nmeros de bper  - Dr. Hester: [phone]  - Dra. Jackquline: [phone]  - Dr. Claudene: [phone]   En caso de inclemencias del tiempo, por favor llame a landry capes principal al [phone] para una actualizacin sobre el [place] de cualquier retraso o cierre.  Consejos para la medicacin en dermatologa: Por favor, guarde las cajas en las que vienen los medicamentos de uso tpico para ayudarle a seguir las instrucciones sobre dnde y cmo usarlos. Las farmacias generalmente imprimen las  instrucciones del medicamento slo en las cajas y no directamente en los tubos del [place].   Si su medicamento es muy caro, por favor, pngase en contacto con landry rieger llamando al [phone] y presione la opcin 4 o envenos un mensaje a travs de Clinical cytogeneticist.   No podemos decirle cul ser su copago por los medicamentos por adelantado ya que esto es diferente dependiendo de la cobertura de su seguro. Sin embargo, es posible que podamos encontrar un medicamento sustituto a Audiological scientist un formulario para que el seguro cubra el medicamento que se considera necesario.   Si se requiere una autorizacin previa para que su compaa de seguros malta su medicamento, por favor permtanos de 1 a 2 das hbiles para completar este proceso.  Los precios de los medicamentos varan con frecuencia dependiendo del Environmental consultant de dnde se surte la receta y alguna farmacias pueden ofrecer precios ms baratos.  El sitio web www.goodrx.com tiene cupones para medicamentos de Health and safety inspector. Los precios aqu no tienen en cuenta lo que podra costar con la ayuda del seguro (puede ser ms barato con su seguro), pero el sitio web puede darle el precio si no utiliz Tourist information centre manager.  - Puede imprimir el cupn correspondiente y llevarlo con su receta a la farmacia.  - Tambin puede pasar por nuestra oficina durante el horario de atencin regular y Education officer, museum una tarjeta de cupones de GoodRx.  - Si necesita que su receta se enve electrnicamente a una farmacia diferente, informe a nuestra oficina a travs de MyChart de [place] o por telfono llamando al [phone] y presione la opcin 4.

## 2024-01-10 ENCOUNTER — Encounter: Payer: Self-pay | Admitting: Dermatology

## 2024-01-14 LAB — SURGICAL PATHOLOGY

## 2024-01-15 ENCOUNTER — Ambulatory Visit: Payer: Self-pay | Admitting: Dermatology

## 2024-01-15 ENCOUNTER — Encounter: Payer: Self-pay | Admitting: Dermatology

## 2024-01-15 NOTE — Telephone Encounter (Signed)
 Patient advised of BX results. aw

## 2024-01-15 NOTE — Telephone Encounter (Addendum)
 Tried calling patient regarding bx results. No answer. LM for patient to return call.  ----- Message from Alm Rhyme sent at 01/15/2024  9:00 AM EDT ----- FINAL DIAGNOSIS        1. Skin, L middle lat tricep :       BASAL CELL CARCINOMA, NODULAR PATTERN    Cancer = BCC Already treated Recheck next visit ----- Message ----- From: Interface, Lab In [facility] [place] Sent: 01/14/2024   5:35 PM EDT To: Alm JAYSON Rhyme, MD

## 2024-02-12 ENCOUNTER — Ambulatory Visit

## 2024-02-13 ENCOUNTER — Ambulatory Visit (HOSPITAL_COMMUNITY)
Admission: RE | Admit: 2024-02-13 | Discharge: 2024-02-13 | Disposition: A | Discharge status: Home / Self Care | Source: Ambulatory Visit | Attending: Internal Medicine | Admitting: Internal Medicine

## 2024-02-13 ENCOUNTER — Other Ambulatory Visit (HOSPITAL_COMMUNITY)

## 2024-02-13 DIAGNOSIS — I517 Cardiomegaly: Secondary | ICD-10-CM | POA: Diagnosis not present

## 2024-02-13 DIAGNOSIS — I7121 Aneurysm of the ascending aorta, without rupture: Secondary | ICD-10-CM | POA: Diagnosis not present

## 2024-02-13 DIAGNOSIS — E869 Volume depletion, unspecified: Secondary | ICD-10-CM | POA: Diagnosis not present

## 2024-02-13 DIAGNOSIS — I2510 Atherosclerotic heart disease of native coronary artery without angina pectoris: Secondary | ICD-10-CM | POA: Diagnosis not present

## 2024-02-13 MED ORDER — IOHEXOL 350 MG/ML SOLN
75.0000 mL | Freq: Once | INTRAVENOUS | Status: AC | PRN
  Administered 2024-02-13: 75 mL via INTRAVENOUS

## 2024-02-20 ENCOUNTER — Ambulatory Visit

## 2024-02-20 VITALS — BP 130/79 | HR 62 | Resp 20 | Ht 68.0 in | Wt 206.0 lb

## 2024-02-20 DIAGNOSIS — I7121 Aneurysm of the ascending aorta, without rupture: Secondary | ICD-10-CM | POA: Diagnosis not present

## 2024-02-20 NOTE — Patient Instructions (Signed)
 Risk Modification in those with ascending thoracic aortic aneurysm:   Continue control of blood pressure (prefer BP 130/80 or less)   2. Avoid fluoroquinolone antibiotics (I.e Ciprofloxacin, Avelox, Levofloxacin, Ofloxacin)   3.  Use of statin (to decrease cardiovascular risk)   4.  Exercise and activity limitations is individualized, but in general, contact sports are to be avoided and one should avoid heavy lifting (defined as half of ideal body weight) and exercises involving sustained Valsalva maneuver.   5.  Follow-up in 1 year with CTA chest.  OK to use a non-contrast CT if you have had a recent study for surveillance of the lung nodule.

## 2024-02-20 NOTE — Progress Notes (Signed)
 [address] [place] [address]             [phone]            Adam Sherman [id number] Jun 07, 1946   History of Present Illness:  [name] is a 78 year old man with medical history of TIA, hypertension, asthma, GERD, and hyperlipidemia who presents for continued surveillance of ascending thoracic aortic aneurysm.  Aneurysm was found in 2022 and has stayed stable in size.  He does have a possible family history of aortic dissection in his father. CTA of chest on 02/13/2024 measured ascending aortic aneurysm at 4.3 cm.   He presents to the clinic today with his wife.  He reports that he has been doing well.  His blood pressure is well controlled with current medication therapy.  He does check his blood pressure at home occasionally and reports readings in the 120s/70s.  He exercises by golfing.  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  He denies chest pain, shortness of breath and lower leg swelling.    Current Outpatient Medications on File Prior to Visit  Medication Sig Dispense Refill   amLODipine  (NORVASC ) 5 MG tablet Take 5 mg by mouth daily.     aspirin  EC 81 MG tablet Take 81 mg by mouth every other day.     cetirizine (ZYRTEC) 5 MG chewable tablet Chew 5 mg by mouth daily.     cyclobenzaprine  (FLEXERIL ) 10 MG tablet Take 10 mg by mouth daily as needed for muscle spasms.     fluorouracil  (EFUDEX ) 5 % cream Starting September 15th apply to the scalp and hands BID x 10 days. 30 g 2   GLUCOSAMINE HCL-MSM PO Take 1 tablet by mouth daily.     Homeopathic Products (ARNICARE ARTHRITIS) TBDP Take by mouth.     ibuprofen (ADVIL) 400 MG tablet Take 400 mg by mouth every 6 (six) hours as needed.     losartan -hydrochlorothiazide  (HYZAAR) 100-25 MG per tablet Take 1 tablet by mouth daily.     omeprazole  (PRILOSEC) 40 MG capsule Take 30-60 min before first meal of the day 30 capsule 2   No  current facility-administered medications on file prior to visit.     ROS: Review of Systems  Constitutional: Negative.  Negative for fever and malaise/fatigue.  Respiratory: Negative.  Negative for cough and shortness of breath.   Cardiovascular: Negative.  Negative for chest pain, palpitations and leg swelling.     BP 130/79   Pulse 62   Resp 20   Ht 5' 8 (1.727 m)   Wt 206 lb (93.4 kg)   SpO2 97% Comment: RA  BMI 31.32 kg/m   Physical Exam Constitutional:      Appearance: Normal appearance.  HENT:     Head: Normocephalic and atraumatic.  Cardiovascular:     Rate and Rhythm: Normal rate and regular rhythm.     Heart sounds: Normal heart sounds, S1 normal and S2 normal.  Pulmonary:     Effort: Pulmonary effort is normal.     Breath sounds: Normal breath sounds.  Skin:    General: Skin is warm and dry.  Neurological:     General: No focal deficit present.     Mental Status: He is alert and oriented to person, place, and time.      Imaging: CLINICAL  DATA:  Aortic aneurysm suspected.   EXAM: CT ANGIOGRAPHY CHEST WITH CONTRAST   TECHNIQUE: Multidetector CT imaging of the chest was performed using the standard protocol during bolus administration of intravenous contrast. Multiplanar CT image reconstructions and MIPs were obtained to evaluate the vascular anatomy.   RADIATION DOSE REDUCTION: This exam was performed according to the departmental dose-optimization program which includes automated exposure control, adjustment of the mA and/or kV according to patient size and/or use of iterative reconstruction technique.   CONTRAST:  75mL OMNIPAQUE  IOHEXOL  350 MG/ML SOLN   COMPARISON:  02/08/2023.   FINDINGS: Cardiovascular: Ascending aorta measures 4.3 cm (601/96), stable. Enlarged pulmonic trunk and heart. No pericardial effusion. Coronary artery calcification.   Mediastinum/Nodes: Low-attenuation lesions in the thyroid  measure up to 12 mm on the right. No  follow-up recommended. (Ref: J Am Coll Radiol. 2015 Feb;12(2): 143-50).No pathologically enlarged mediastinal, hilar or axillary lymph nodes. Esophagus is grossly unremarkable.   Lungs/Pleura: 4 mm lingular nodule (303/53), unchanged. Per Fleischner Society guidelines, no follow-up is necessary. Mild volume loss in the lingula and left lower lobe, adjacent to an elevated left hemidiaphragm. No pleural fluid. Airway is unremarkable.   Upper Abdomen: Low-attenuation lesion in the left kidney. No specific follow-up necessary. Visualized portions of the liver, gallbladder, adrenal glands, kidneys, spleen, pancreas, stomach and bowel are otherwise grossly unremarkable. No upper abdominal adenopathy. Atherosclerotic calcification of the aorta.   Musculoskeletal: Degenerative changes in the spine.   Review of the MIP images confirms the above findings.   IMPRESSION: 1. 4.3 cm ascending aortic aneurysm, stable when remeasured in a similar fashion. Recommend annual imaging followup by CTA or MRA. This recommendation follows 2010 ACCF/AHA/AATS/ACR/ASA/SCA/SCAI/SIR/STS/SVM Guidelines for the Diagnosis and Management of Patients with Thoracic Aortic Disease. Circulation. 2010; 121: Z733-z630. Aortic aneurysm NOS (ICD10-I71.9). 2. Enlarged pulmonic trunk, indicative of pulmonary arterial hypertension. 3. Aortic atherosclerosis (ICD10-I70.0). Coronary artery calcification.     Electronically Signed   By: Newell Eke M.D.   On: 02/13/2024 10:00    A/P: Aneurysm of ascending aorta without rupture (HCC) -4.3 cm ascending thoracic aortic aneurysm on CTA of chest. We discussed the natural history and and risk factors for growth of ascending aortic aneurysms. Discussed recommendations to minimize the risk of further expansion or dissection including careful blood pressure control, avoidance of contact sports and heavy lifting, attention to lipid management.  We covered the importance of  continued smoking cessation.  The patient does not yet meet surgical criteria of >5.5cm. The patient is aware of signs and symptoms of aortic dissection and when to present to the emergency department     -Follow up in one year with CTA of chest   Risk Modification:  Statin:  not currently prescribed  Smoking cessation instruction/counseling given:  commended patient for quitting and reviewed strategies for preventing relapses  Patient was counseled on importance of Blood Pressure Control  They are instructed to contact their Primary Care Physician if they start to have blood pressure readings over 130s/90s. Do not ever stop blood pressure medications on your own, unless instructed by healthcare professional.  Please avoid use of Fluoroquinolones as this can potentially increase your risk of Aortic Rupture and/or Dissection  Patient educated on signs and symptoms of Aortic Dissection, handout also provided in AVS  Manuelita CHRISTELLA Rough, PA-C 02/20/24

## 2024-04-23 DIAGNOSIS — E781 Pure hyperglyceridemia: Secondary | ICD-10-CM | POA: Diagnosis not present

## 2024-04-30 DIAGNOSIS — I7121 Aneurysm of the ascending aorta, without rupture: Secondary | ICD-10-CM | POA: Diagnosis not present

## 2024-04-30 DIAGNOSIS — I10 Essential (primary) hypertension: Secondary | ICD-10-CM | POA: Diagnosis not present

## 2024-04-30 DIAGNOSIS — Z0000 Encounter for general adult medical examination without abnormal findings: Secondary | ICD-10-CM | POA: Diagnosis not present

## 2024-04-30 DIAGNOSIS — I6523 Occlusion and stenosis of bilateral carotid arteries: Secondary | ICD-10-CM | POA: Diagnosis not present

## 2024-04-30 DIAGNOSIS — E7800 Pure hypercholesterolemia, unspecified: Secondary | ICD-10-CM | POA: Diagnosis not present

## 2024-04-30 DIAGNOSIS — R7301 Impaired fasting glucose: Secondary | ICD-10-CM | POA: Diagnosis not present

## 2024-06-25 ENCOUNTER — Ambulatory Visit: Admitting: Dermatology

## 2024-06-25 ENCOUNTER — Encounter: Payer: Self-pay | Admitting: Dermatology

## 2024-06-25 DIAGNOSIS — Z85828 Personal history of other malignant neoplasm of skin: Secondary | ICD-10-CM | POA: Diagnosis not present

## 2024-06-25 DIAGNOSIS — L578 Other skin changes due to chronic exposure to nonionizing radiation: Secondary | ICD-10-CM | POA: Diagnosis not present

## 2024-06-25 DIAGNOSIS — D692 Other nonthrombocytopenic purpura: Secondary | ICD-10-CM

## 2024-06-25 DIAGNOSIS — D229 Melanocytic nevi, unspecified: Secondary | ICD-10-CM

## 2024-06-25 DIAGNOSIS — Z1283 Encounter for screening for malignant neoplasm of skin: Secondary | ICD-10-CM

## 2024-06-25 DIAGNOSIS — D1801 Hemangioma of skin and subcutaneous tissue: Secondary | ICD-10-CM

## 2024-06-25 DIAGNOSIS — L820 Inflamed seborrheic keratosis: Secondary | ICD-10-CM | POA: Diagnosis not present

## 2024-06-25 DIAGNOSIS — L729 Follicular cyst of the skin and subcutaneous tissue, unspecified: Secondary | ICD-10-CM

## 2024-06-25 DIAGNOSIS — L570 Actinic keratosis: Secondary | ICD-10-CM

## 2024-06-25 DIAGNOSIS — L720 Epidermal cyst: Secondary | ICD-10-CM | POA: Diagnosis not present

## 2024-06-25 DIAGNOSIS — L821 Other seborrheic keratosis: Secondary | ICD-10-CM

## 2024-06-25 DIAGNOSIS — W908XXA Exposure to other nonionizing radiation, initial encounter: Secondary | ICD-10-CM

## 2024-06-25 DIAGNOSIS — L814 Other melanin hyperpigmentation: Secondary | ICD-10-CM

## 2024-06-25 DIAGNOSIS — Z8589 Personal history of malignant neoplasm of other organs and systems: Secondary | ICD-10-CM

## 2024-06-25 NOTE — Progress Notes (Signed)
 "  Follow-Up Visit   Subjective  Adam Sherman is a 79 y.o. male who presents for the following: Skin Cancer Screening and Full Body Skin Exam Hx of bcc, hx of scc,  hx of aks, hx of isks   Used cream bid for a week   The patient presents for Total-Body Skin Exam (TBSE) for skin cancer screening and mole check. The patient has spots, moles and lesions to be evaluated, some may be new or changing and the patient may have concern these could be cancer.  The following portions of the chart were reviewed this encounter and updated as appropriate: medications, allergies, medical history  Review of Systems:  No other skin or systemic complaints except as noted in HPI or Assessment and Plan.  Objective  Well appearing patient in no apparent distress; mood and affect are within normal limits.  A full examination was performed including scalp, head, eyes, ears, nose, lips, neck, chest, axillae, abdomen, back, buttocks, bilateral upper extremities, bilateral lower extremities, hands, feet, fingers, toes, fingernails, and toenails. All findings within normal limits unless otherwise noted below.   Relevant physical exam findings are noted in the Assessment and Plan.  scalp / face / ears x 22 (22) Erythematous thin papules/macules with gritty scale.  right hand x 1, face x 2 (3) Erythematous stuck-on, waxy papule or plaque  Assessment & Plan   HISTORY OF SQUAMOUS CELL CARCINOMA OF THE SKIN 08/05/2018 right dorsum hand - No evidence of recurrence today - No lymphadenopathy - Recommend regular full body skin exams - Recommend daily broad spectrum sunscreen SPF 30+ to sun-exposed areas, reapply every 2 hours as needed.  - Call if any new or changing lesions are noted between office visits  HISTORY OF BASAL CELL CARCINOMA OF THE SKIN 01/09/2024 Left middle lateral tricep trx with ED&C  - No evidence of recurrence today - Recommend regular full body skin exams - Recommend daily broad spectrum  sunscreen SPF 30+ to sun-exposed areas, reapply every 2 hours as needed.  - Call if any new or changing lesions are noted between office visits  SKIN CANCER SCREENING PERFORMED TODAY.  ACTINIC DAMAGE - Chronic condition, secondary to cumulative UV/sun exposure - diffuse scaly erythematous macules with underlying dyspigmentation - Recommend daily broad spectrum sunscreen SPF 30+ to sun-exposed areas, reapply every 2 hours as needed.  - Staying in the shade or wearing long sleeves, sun glasses (UVA+UVB protection) and wide brim hats (4-inch brim around the entire circumference of the hat) are also recommended for sun protection.  - Call for new or changing lesions.  LENTIGINES, SEBORRHEIC KERATOSES, HEMANGIOMAS - Benign normal skin lesions - Benign-appearing - Call for any changes  MELANOCYTIC NEVI - Tan-brown and/or pink-flesh-colored symmetric macules and papules - Benign appearing on exam today - Observation - Call clinic for new or changing moles - Recommend daily use of broad spectrum spf 30+ sunscreen to sun-exposed areas.   Purpura - Chronic; persistent and recurrent.  Treatable, but not curable. - Violaceous macules and patches - Benign - Related to trauma, age, sun damage and/or use of blood thinners, chronic use of topical and/or oral steroids - Observe - Can use OTC arnica containing moisturizer such as Dermend Bruise Formula if desired - Call for worsening or other concerns  EPIDERMAL INCLUSION CYST Exam: Subcutaneous nodule at R sup med scapula 1.5 cm  Discussed no longer doing surgery due to neck surgery no longer doing surgery if bothersome other docotor Cyst with symptoms and/or recent  change.  Discussed surgical excision to remove, including resulting scar and possible recurrence.  Patient will schedule for surgery. Pre-op information given.    ACTINIC KERATOSIS (22) scalp / face / ears x 22 (22) Actinic keratoses are precancerous spots that appear secondary to  cumulative UV radiation exposure/sun exposure over time. They are chronic with expected duration over 1 year. A portion of actinic keratoses will progress to squamous cell carcinoma of the skin. It is not possible to reliably predict which spots will progress to skin cancer and so treatment is recommended to prevent development of skin cancer.  Recommend daily broad spectrum sunscreen SPF 30+ to sun-exposed areas, reapply every 2 hours as needed.  Recommend staying in the shade or wearing long sleeves, sun glasses (UVA+UVB protection) and wide brim hats (4-inch brim around the entire circumference of the hat). Call for new or changing lesions. - Destruction of lesion - scalp / face / ears x 22 (22) Complexity: simple   Destruction method: cryotherapy   Informed consent: discussed and consent obtained   Timeout:  patient name, date of birth, surgical site, and procedure verified Lesion destroyed using liquid nitrogen: Yes   Region frozen until ice ball extended beyond lesion: Yes   Outcome: patient tolerated procedure well with no complications   Post-procedure details: wound care instructions given    INFLAMED SEBORRHEIC KERATOSIS (3) right hand x 1, face x 2 (3) Symptomatic, irritating, patient would like treated. - Destruction of lesion - right hand x 1, face x 2 (3) Complexity: simple   Destruction method: cryotherapy   Informed consent: discussed and consent obtained   Timeout:  patient name, date of birth, surgical site, and procedure verified Lesion destroyed using liquid nitrogen: Yes   Region frozen until ice ball extended beyond lesion: Yes   Outcome: patient tolerated procedure well with no complications   Post-procedure details: wound care instructions given    Return in about 6 months (around 12/23/2024) for ak follow up, 1 year tbse hx of bcc.  IEleanor Blush, CMA, am acting as scribe for Alm Rhyme, MD.   Documentation: I have reviewed the above documentation for  accuracy and completeness, and I agree with the above.  Alm Rhyme, MD    "

## 2024-06-25 NOTE — Patient Instructions (Addendum)
 Actinic keratoses are precancerous spots that appear secondary to cumulative UV radiation exposure/sun exposure over time. They are chronic with expected duration over 1 year. A portion of actinic keratoses will progress to squamous cell carcinoma of the skin. It is not possible to reliably predict which spots will progress to skin cancer and so treatment is recommended to prevent development of skin cancer.  Recommend daily broad spectrum sunscreen SPF 30+ to sun-exposed areas, reapply every 2 hours as needed.  Recommend staying in the shade or wearing long sleeves, sun glasses (UVA+UVB protection) and wide brim hats (4-inch brim around the entire circumference of the hat). Call for new or changing lesions.     Cryotherapy Aftercare  Wash gently with soap and water everyday.   Apply Vaseline and Band-Aid daily until healed.    Seborrheic Keratosis  What causes seborrheic keratoses? Seborrheic keratoses are harmless, common skin growths that first appear during adult life.  As time goes by, more growths appear.  Some people may develop a large number of them.  Seborrheic keratoses appear on both covered and uncovered body parts.  They are not caused by sunlight.  The tendency to develop seborrheic keratoses can be inherited.  They vary in color from skin-colored to gray, brown, or even black.  They can be either smooth or have a rough, warty surface.   Seborrheic keratoses are superficial and look as if they were stuck on the skin.  Under the microscope this type of keratosis looks like layers upon layers of skin.  That is why at times the top layer may seem to fall off, but the rest of the growth remains and re-grows.    Treatment Seborrheic keratoses do not need to be treated, but can easily be removed in the office.  Seborrheic keratoses often cause symptoms when they rub on clothing or jewelry.  Lesions can be in the way of shaving.  If they become inflamed, they can cause itching, soreness,  or burning.  Removal of a seborrheic keratosis can be accomplished by freezing, burning, or surgery. If any spot bleeds, scabs, or grows rapidly, please return to have it checked, as these can be an indication of a skin cancer.   Melanoma ABCDEs  Melanoma is the most dangerous type of skin cancer, and is the leading cause of death from skin disease.  You are more likely to develop melanoma if you: Have light-colored skin, light-colored eyes, or red or blond hair Spend a lot of time in the sun Tan regularly, either outdoors or in a tanning bed Have had blistering sunburns, especially during childhood Have a close family member who has had a melanoma Have atypical moles or large birthmarks  Early detection of melanoma is key since treatment is typically straightforward and cure rates are extremely high if we catch it early.   The first sign of melanoma is often a change in a mole or a new dark spot.  The ABCDE system is a way of remembering the signs of melanoma.  A for asymmetry:  The two halves do not match. B for border:  The edges of the growth are irregular. C for color:  A mixture of colors are present instead of an even brown color. D for diameter:  Melanomas are usually (but not always) greater than 6mm - the size of a pencil eraser. E for evolution:  The spot keeps changing in size, shape, and color.  Please check your skin once per month between visits. You can  use a small mirror in front and a large mirror behind you to keep an eye on the back side or your body.   If you see any new or changing lesions before your next follow-up, please call to schedule a visit.  Please continue daily skin protection including broad spectrum sunscreen SPF 30+ to sun-exposed areas, reapplying every 2 hours as needed when you're outdoors.   Staying in the shade or wearing long sleeves, sun glasses (UVA+UVB protection) and wide brim hats (4-inch brim around the entire circumference of the hat) are  also recommended for sun protection.    Due to recent changes in healthcare laws, you may see results of your pathology and/or laboratory studies on MyChart before the doctors have had a chance to review them. We understand that in some cases there may be results that are confusing or concerning to you. Please understand that not all results are received at the same time and often the doctors may need to interpret multiple results in order to provide you with the best plan of care or course of treatment. Therefore, we ask that you please give us  2 business days to thoroughly review all your results before contacting the office for clarification. Should we see a critical lab result, you will be contacted sooner.   If You Need Anything After Your Visit  If you have any questions or concerns for your doctor, please call our main line at [phone] and press option 4 to reach your doctor's medical assistant. If no one answers, please leave a voicemail as directed and we will return your call as soon as possible. Messages left after 4 pm will be answered the following business day.   You may also send us  a message via MyChart. We typically respond to MyChart messages within 1-2 business days.  For prescription refills, please ask your pharmacy to contact our office. Our fax number is [phone].  If you have an urgent issue when the clinic is closed that cannot wait until the next business day, you can page your doctor at the number below.    Please note that while we do our best to be available for urgent issues outside of office hours, we are not available 24/7.   If you have an urgent issue and are unable to reach us , you may choose to seek medical care at your doctor's office, retail clinic, urgent care center, or emergency room.  If you have a medical emergency, please immediately call 911 or go to the emergency department.  Pager Numbers  - Dr. Hester: [phone]  - Dr. Jackquline:  [phone]  - Dr. Claudene: [phone]   - Dr. Raymund: [phone]  In the event of inclement weather, please call our main line at [phone] for an update on the status of any delays or closures.  Dermatology Medication Tips: Please keep the boxes that topical medications come in in order to help keep track of the instructions about where and how to use these. Pharmacies typically print the medication instructions only on the boxes and not directly on the medication tubes.   If your medication is too expensive, please contact our office at [phone] option 4 or send us  a message through MyChart.   We are unable to tell what your co-pay for medications will be in advance as this is different depending on your insurance coverage. However, we may be able to find a substitute medication at lower cost or fill out paperwork to get insurance to cover  a needed medication.   If a prior authorization is required to get your medication covered by your insurance company, please allow us  1-2 business days to complete this process.  Drug prices often vary depending on where the prescription is filled and some pharmacies may offer cheaper prices.  The website www.goodrx.com contains coupons for medications through different pharmacies. The prices here do not account for what the cost may be with help from insurance (it may be cheaper with your insurance), but the website can give you the price if you did not use any insurance.  - You can print the associated coupon and take it with your prescription to the pharmacy.  - You may also stop by our office during regular business hours and pick up a GoodRx coupon card.  - If you need your prescription sent electronically to a different pharmacy, notify our office through [facility] or by phone at [phone] option 4.     Si Usted Necesita Algo Despus de Su Visita  Tambin puede enviarnos un mensaje a travs de Clinical cytogeneticist. Por lo general  respondemos a los mensajes de MyChart en el transcurso de 1 a 2 das hbiles.  Para renovar recetas, por favor pida a su farmacia que se ponga en contacto con nuestra oficina. Randi lakes de fax es [place] [phone].  Si tiene un asunto urgente cuando la clnica est cerrada y que no puede esperar hasta el siguiente da hbil, puede llamar/localizar a su doctor(a) al nmero que aparece a continuacin.   Por favor, tenga en cuenta que aunque hacemos todo lo posible para estar disponibles para asuntos urgentes fuera del horario de [place], no estamos disponibles las 24 horas del da, los 7 [address]  [address] de la [place].   Si tiene un problema urgente y no puede comunicarse con nosotros, puede optar por buscar atencin mdica  en el consultorio de su doctor(a), en una clnica privada, en un centro de atencin urgente o en una sala de emergencias.  Si tiene Engineer, drilling, por favor llame inmediatamente al 911 o vaya a la sala de emergencias.  Nmeros de bper  - Dr. Hester: [phone]  - Dra. Jackquline: [phone]  - Dr. Claudene: [phone]  - Dra. Kitts: [phone]  En caso de inclemencias del [place], por favor llame a nuestra lnea principal al [phone] para una actualizacin sobre el estado de cualquier retraso o cierre.  Consejos para la medicacin en dermatologa: Por favor, guarde las cajas en las que vienen los medicamentos de uso tpico para ayudarle a seguir las instrucciones sobre dnde y cmo usarlos. Las farmacias generalmente imprimen las instrucciones del medicamento slo en las cajas y no directamente en los tubos del [place].   Si su medicamento es muy caro, por favor, pngase en contacto con landry rieger llamando al [phone] y presione la opcin 4 o envenos un mensaje a travs de Clinical cytogeneticist.   No podemos decirle cul ser su copago por los medicamentos por adelantado ya que esto es diferente dependiendo de la cobertura de su seguro. Sin embargo, es posible  que podamos encontrar un medicamento sustituto a Audiological scientist un formulario para que el seguro cubra el medicamento que se considera necesario.   Si se requiere una autorizacin previa para que su compaa de seguros malta su medicamento, por favor permtanos de 1 a 2 das hbiles para completar este proceso.  Los precios de los medicamentos varan con frecuencia dependiendo del Environmental consultant de dnde se surte la receta y iraq  pueden ofrecer precios ms baratos.  El sitio web www.goodrx.com tiene cupones para medicamentos de Health and safety inspector. Los precios aqu no tienen en cuenta lo que podra costar con la ayuda del seguro (puede ser ms barato con su seguro), pero el sitio web puede darle el precio si no utiliz Tourist information centre manager.  - Puede imprimir el cupn correspondiente y llevarlo con su receta a la farmacia.  - Tambin puede pasar por nuestra oficina durante el horario de atencin regular y Education officer, museum una tarjeta de cupones de GoodRx.  - Si necesita que su receta se enve electrnicamente a una farmacia diferente, informe a nuestra oficina a travs de MyChart de [facility] o por telfono llamando al [phone] y presione la opcin 4.

## 2024-12-31 ENCOUNTER — Ambulatory Visit: Admitting: Dermatology

## 2025-07-01 ENCOUNTER — Ambulatory Visit: Admitting: Dermatology
